# Patient Record
Sex: Male | Born: 1940 | Race: White | Hispanic: No | Smoking: Never smoker
Health system: Southern US, Community
[De-identification: ages and names within clinical notes are randomized; demographics above are authoritative.]

## PROBLEM LIST (undated history)

## (undated) DIAGNOSIS — K589 Irritable bowel syndrome without diarrhea: Secondary | ICD-10-CM

## (undated) DIAGNOSIS — I351 Nonrheumatic aortic (valve) insufficiency: Secondary | ICD-10-CM

## (undated) DIAGNOSIS — I10 Essential (primary) hypertension: Secondary | ICD-10-CM

## (undated) DIAGNOSIS — K219 Gastro-esophageal reflux disease without esophagitis: Secondary | ICD-10-CM

## (undated) DIAGNOSIS — J189 Pneumonia, unspecified organism: Secondary | ICD-10-CM

## (undated) DIAGNOSIS — N1832 Chronic kidney disease, stage 3b: Secondary | ICD-10-CM

## (undated) DIAGNOSIS — E785 Hyperlipidemia, unspecified: Secondary | ICD-10-CM

## (undated) DIAGNOSIS — Z87442 Personal history of urinary calculi: Secondary | ICD-10-CM

## (undated) DIAGNOSIS — R911 Solitary pulmonary nodule: Secondary | ICD-10-CM

## (undated) DIAGNOSIS — I480 Paroxysmal atrial fibrillation: Secondary | ICD-10-CM

## (undated) DIAGNOSIS — F32A Depression, unspecified: Secondary | ICD-10-CM

## (undated) DIAGNOSIS — E1122 Type 2 diabetes mellitus with diabetic chronic kidney disease: Secondary | ICD-10-CM

## (undated) DIAGNOSIS — N400 Enlarged prostate without lower urinary tract symptoms: Secondary | ICD-10-CM

## (undated) DIAGNOSIS — I2510 Atherosclerotic heart disease of native coronary artery without angina pectoris: Secondary | ICD-10-CM

## (undated) DIAGNOSIS — I219 Acute myocardial infarction, unspecified: Secondary | ICD-10-CM

## (undated) HISTORY — PX: CARDIAC CATHETERIZATION: SHX172

## (undated) HISTORY — PX: EXPLORATORY LAPAROTOMY: SUR591

---

## 1981-11-13 HISTORY — PX: EXPLORATORY LAPAROTOMY: SUR591

## 2000-07-14 DIAGNOSIS — I219 Acute myocardial infarction, unspecified: Secondary | ICD-10-CM

## 2000-07-14 HISTORY — DX: Acute myocardial infarction, unspecified: I21.9

## 2000-07-23 HISTORY — PX: CORONARY ARTERY BYPASS GRAFT: SHX141

## 2005-07-20 ENCOUNTER — Ambulatory Visit: Payer: Self-pay | Admitting: Gastroenterology

## 2005-08-03 ENCOUNTER — Ambulatory Visit: Payer: Self-pay | Admitting: Gastroenterology

## 2005-08-03 HISTORY — PX: COLONOSCOPY: SHX174

## 2006-01-08 ENCOUNTER — Ambulatory Visit: Payer: Self-pay | Admitting: Gastroenterology

## 2006-01-08 HISTORY — PX: ESOPHAGOGASTRODUODENOSCOPY: SHX1529

## 2011-07-29 ENCOUNTER — Inpatient Hospital Stay: Payer: Self-pay | Admitting: *Deleted

## 2011-10-03 ENCOUNTER — Ambulatory Visit: Payer: Self-pay | Admitting: Specialist

## 2012-02-28 ENCOUNTER — Ambulatory Visit: Payer: Self-pay | Admitting: Internal Medicine

## 2012-05-15 ENCOUNTER — Ambulatory Visit: Payer: Self-pay | Admitting: Specialist

## 2012-05-15 LAB — CREATININE, SERUM
Creatinine: 1.24 mg/dL (ref 0.60–1.30)
EGFR (African American): >60

## 2014-04-28 ENCOUNTER — Ambulatory Visit: Payer: Self-pay | Admitting: Internal Medicine

## 2015-02-12 ENCOUNTER — Emergency Department
Admit: 2015-02-12 | Disposition: A | Discharge status: Home / Self Care | Payer: Self-pay | Admitting: Emergency Medicine

## 2015-02-12 LAB — COMPREHENSIVE METABOLIC PANEL
ANION GAP: 6 — AB (ref 7–16)
Albumin: 3.3 g/dL — ABNORMAL LOW
Alkaline Phosphatase: 53 U/L
BUN: 28 mg/dL — AB
Bilirubin,Total: 0.7 mg/dL
CALCIUM: 8.5 mg/dL — AB
Chloride: 110 mmol/L
Co2: 24 mmol/L
Creatinine: 1.24 mg/dL
GFR CALC NON AF AMER: 57 — AB
GLUCOSE: 98 mg/dL
Potassium: 4.6 mmol/L
SGOT(AST): 22 U/L
SGPT (ALT): 12 U/L — ABNORMAL LOW
Sodium: 140 mmol/L
TOTAL PROTEIN: 6.4 g/dL — AB

## 2015-02-12 LAB — CBC
HCT: 39.6 % — AB (ref 40.0–52.0)
HGB: 13.1 g/dL (ref 13.0–18.0)
MCH: 32.5 pg (ref 26.0–34.0)
MCHC: 33.2 g/dL (ref 32.0–36.0)
MCV: 98 fL (ref 80–100)
Platelet: 169 10*3/uL (ref 150–440)
RBC: 4.05 10*6/uL — AB (ref 4.40–5.90)
RDW: 13.8 % (ref 11.5–14.5)
WBC: 5.9 10*3/uL (ref 3.8–10.6)

## 2015-02-12 LAB — URINALYSIS, COMPLETE
BACTERIA: NONE SEEN
BILIRUBIN, UR: NEGATIVE
Blood: NEGATIVE
GLUCOSE, UR: NEGATIVE mg/dL (ref 0–75)
KETONE: NEGATIVE
LEUKOCYTE ESTERASE: NEGATIVE
Nitrite: NEGATIVE
Ph: 5 (ref 4.5–8.0)
Protein: 100
SPECIFIC GRAVITY: 1.026 (ref 1.003–1.030)
SQUAMOUS EPITHELIAL: NONE SEEN

## 2015-02-12 LAB — TROPONIN I
Troponin-I: <0.03 ng/mL
Troponin-I: <0.03 ng/mL

## 2017-03-22 NOTE — Discharge Instructions (Signed)
Cataract Surgery, Care After °Refer to this sheet in the next few weeks. These instructions provide you with information about caring for yourself after your procedure. Your health care provider may also give you more specific instructions. Your treatment has been planned according to current medical practices, but problems sometimes occur. Call your health care provider if you have any problems or questions after your procedure. °What can I expect after the procedure? °After the procedure, it is common to have: °· Itching. °· Discomfort. °· Fluid discharge. °· Sensitivity to light and to touch. °· Bruising. °Follow these instructions at home: °Eye Care  °· Check your eye every day for signs of infection. Watch for: °¨ Redness, swelling, or pain. °¨ Fluid, blood, or pus. °¨ Warmth. °¨ Bad smell. °Activity  °· Avoid strenuous activities, such as playing contact sports, for as long as told by your health care provider. °· Do not drive or operate heavy machinery until your health care provider approves. °· Do not bend or lift heavy objects . Bending increases pressure in the eye. You can walk, climb stairs, and do light household chores. °· Ask your health care provider when you can return to work. If you work in a dusty environment, you may be advised to wear protective eyewear for a period of time. °General instructions  °· Take or apply over-the-counter and prescription medicines only as told by your health care provider. This includes eye drops. °· Do not touch or rub your eyes. °· If you were given a protective shield, wear it as told by your health care provider. If you were not given a protective shield, wear sunglasses as told by your health care provider to protect your eyes. °· Keep the area around your eye clean and dry. Avoid swimming or allowing water to hit you directly in the face while showering until told by your health care provider. Keep soap and shampoo out of your eyes. °· Do not put a contact lens  into the affected eye or eyes until your health care provider approves. °· Keep all follow-up visits as told by your health care provider. This is important. °Contact a health care provider if: ° °· You have increased bruising around your eye. °· You have pain that is not helped with medicine. °· You have a fever. °· You have redness, swelling, or pain in your eye. °· You have fluid, blood, or pus coming from your incision. °· Your vision gets worse. °Get help right away if: °· You have sudden vision loss. °This information is not intended to replace advice given to you by your health care provider. Make sure you discuss any questions you have with your health care provider. °Document Released: 05/19/2005 Document Revised: 03/09/2016 Document Reviewed: 09/09/2015 °Elsevier Interactive Patient Education © 2017 [employer] ° ° ° ° °General Anesthesia, Adult, Care After °These instructions provide you with information about caring for yourself after your procedure. Your health care provider may also give you more specific instructions. Your treatment has been planned according to current medical practices, but problems sometimes occur. Call your health care provider if you have any problems or questions after your procedure. °What can I expect after the procedure? °After the procedure, it is common to have: °· Vomiting. °· A sore throat. °· Mental slowness. °It is common to feel: °· Nauseous. °· Cold or shivery. °· Sleepy. °· Tired. °· Sore or achy, even in parts of your body where you did not have surgery. °Follow these instructions at   home: °For at least 24 hours after the procedure:  °· Do not: °¨ Participate in activities where you could fall or become injured. °¨ Drive. °¨ Use heavy machinery. °¨ Drink alcohol. °¨ Take sleeping pills or medicines that cause drowsiness. °¨ Make important decisions or sign legal documents. °¨ Take care of children on your own. °· Rest. °Eating and drinking  °· If you vomit, drink  water, juice, or soup when you can drink without vomiting. °· Drink enough fluid to keep your urine clear or pale yellow. °· Make sure you have little or no nausea before eating solid foods. °· Follow the diet recommended by your health care provider. °General instructions  °· Have a responsible adult stay with you until you are awake and alert. °· Return to your normal activities as told by your health care provider. Ask your health care provider what activities are safe for you. °· Take over-the-counter and prescription medicines only as told by your health care provider. °· If you smoke, do not smoke without supervision. °· Keep all follow-up visits as told by your health care provider. This is important. °Contact a health care provider if: °· You continue to have nausea or vomiting at home, and medicines are not helpful. °· You cannot drink fluids or start eating again. °· You cannot urinate after 8-12 hours. °· You develop a skin rash. °· You have fever. °· You have increasing redness at the site of your procedure. °Get help right away if: °· You have difficulty breathing. °· You have chest pain. °· You have unexpected bleeding. °· You feel that you are having a life-threatening or urgent problem. °This information is not intended to replace advice given to you by your health care provider. Make sure you discuss any questions you have with your health care provider. °Document Released: 02/05/2001 Document Revised: 04/03/2016 Document Reviewed: 10/14/2015 °Elsevier Interactive Patient Education © 2017 [employer] ° °

## 2017-03-26 ENCOUNTER — Encounter: Payer: Self-pay | Admitting: *Deleted

## 2017-03-28 ENCOUNTER — Ambulatory Visit
Admission: RE | Admit: 2017-03-28 | Discharge: 2017-03-28 | Disposition: A | Discharge status: Home / Self Care | Payer: Medicare HMO | Source: Ambulatory Visit | Attending: Ophthalmology | Admitting: Ophthalmology

## 2017-03-28 ENCOUNTER — Ambulatory Visit: Payer: Medicare HMO | Admitting: Anesthesiology

## 2017-03-28 ENCOUNTER — Encounter
Admission: RE | Disposition: A | Discharge status: Home / Self Care | Payer: Self-pay | Source: Ambulatory Visit | Attending: Ophthalmology

## 2017-03-28 DIAGNOSIS — K219 Gastro-esophageal reflux disease without esophagitis: Secondary | ICD-10-CM | POA: Diagnosis not present

## 2017-03-28 DIAGNOSIS — I10 Essential (primary) hypertension: Secondary | ICD-10-CM | POA: Insufficient documentation

## 2017-03-28 DIAGNOSIS — I2510 Atherosclerotic heart disease of native coronary artery without angina pectoris: Secondary | ICD-10-CM | POA: Insufficient documentation

## 2017-03-28 DIAGNOSIS — I252 Old myocardial infarction: Secondary | ICD-10-CM | POA: Insufficient documentation

## 2017-03-28 DIAGNOSIS — H2512 Age-related nuclear cataract, left eye: Secondary | ICD-10-CM | POA: Diagnosis not present

## 2017-03-28 HISTORY — PX: CATARACT EXTRACTION W/PHACO: SHX586

## 2017-03-28 HISTORY — DX: Gastro-esophageal reflux disease without esophagitis: K21.9

## 2017-03-28 HISTORY — DX: Acute myocardial infarction, unspecified: I21.9

## 2017-03-28 HISTORY — DX: Atherosclerotic heart disease of native coronary artery without angina pectoris: I25.10

## 2017-03-28 HISTORY — DX: Nonrheumatic aortic (valve) insufficiency: I35.1

## 2017-03-28 HISTORY — DX: Essential (primary) hypertension: I10

## 2017-03-28 HISTORY — DX: Hyperlipidemia, unspecified: E78.5

## 2017-03-28 SURGERY — PHACOEMULSIFICATION, CATARACT, WITH IOL INSERTION
Anesthesia: Monitor Anesthesia Care | Laterality: Left | Wound class: Clean

## 2017-03-28 MED ORDER — EPINEPHRINE PF 1 MG/ML IJ SOLN
INTRAOCULAR | Status: DC | PRN
  Administered 2017-03-28: 84 mL via OPHTHALMIC

## 2017-03-28 MED ORDER — MOXIFLOXACIN HCL 0.5 % OP SOLN
1.0000 [drp] | OPHTHALMIC | Status: DC | PRN
  Administered 2017-03-28 (×3): 1 [drp] via OPHTHALMIC

## 2017-03-28 MED ORDER — FENTANYL CITRATE (PF) 100 MCG/2ML IJ SOLN
INTRAMUSCULAR | Status: DC | PRN
  Administered 2017-03-28: 50 ug via INTRAVENOUS

## 2017-03-28 MED ORDER — MIDAZOLAM HCL 2 MG/2ML IJ SOLN
INTRAMUSCULAR | Status: DC | PRN
  Administered 2017-03-28: 2 mg via INTRAVENOUS

## 2017-03-28 MED ORDER — ARMC OPHTHALMIC DILATING DROPS
1.0000 "application " | OPHTHALMIC | Status: DC | PRN
  Administered 2017-03-28 (×3): 1 via OPHTHALMIC

## 2017-03-28 MED ORDER — BRIMONIDINE TARTRATE-TIMOLOL 0.2-0.5 % OP SOLN
OPHTHALMIC | Status: DC | PRN
  Administered 2017-03-28: 1 [drp] via OPHTHALMIC

## 2017-03-28 MED ORDER — LIDOCAINE HCL (PF) 2 % IJ SOLN
INTRAOCULAR | Status: DC | PRN
  Administered 2017-03-28: 1 mL via INTRAOCULAR

## 2017-03-28 MED ORDER — CEFUROXIME OPHTHALMIC INJECTION 1 MG/0.1 ML
INJECTION | OPHTHALMIC | Status: DC | PRN
  Administered 2017-03-28: 0.1 mL via OPHTHALMIC

## 2017-03-28 MED ORDER — NA HYALUR & NA CHOND-NA HYALUR 0.4-0.35 ML IO KIT
PACK | INTRAOCULAR | Status: DC | PRN
  Administered 2017-03-28: 1 mL via INTRAOCULAR

## 2017-03-28 MED ORDER — LACTATED RINGERS IV SOLN: INTRAVENOUS | Status: DC

## 2017-03-28 SURGICAL SUPPLY — 25 items
CANNULA ANT/CHMB 27GA (MISCELLANEOUS) ×3 IMPLANT
CARTRIDGE ABBOTT (MISCELLANEOUS) IMPLANT
GLOVE SURG LX 7.5 STRW (GLOVE) ×2
GLOVE SURG LX STRL 7.5 STRW (GLOVE) ×1 IMPLANT
GLOVE SURG TRIUMPH 8.0 PF LTX (GLOVE) ×3 IMPLANT
GOWN STRL REUS W/ TWL LRG LVL3 (GOWN DISPOSABLE) ×2 IMPLANT
GOWN STRL REUS W/TWL LRG LVL3 (GOWN DISPOSABLE) ×4
LENS IOL TECNIS ITEC 21.0 (Intraocular Lens) ×3 IMPLANT
MARKER SKIN DUAL TIP RULER LAB (MISCELLANEOUS) ×3 IMPLANT
NDL RETROBULBAR .5 NSTRL (NEEDLE) IMPLANT
NEEDLE FILTER BLUNT 18X 1/2SAF (NEEDLE) ×2
NEEDLE FILTER BLUNT 18X1 1/2 (NEEDLE) ×1 IMPLANT
PACK CATARACT BRASINGTON (MISCELLANEOUS) ×3 IMPLANT
PACK EYE AFTER SURG (MISCELLANEOUS) ×3 IMPLANT
PACK OPTHALMIC (MISCELLANEOUS) ×3 IMPLANT
RING MALYGIN 7.0 (MISCELLANEOUS) IMPLANT
SUT ETHILON 10-0 CS-B-6CS-B-6 (SUTURE)
SUT VICRYL  9 0 (SUTURE)
SUT VICRYL 9 0 (SUTURE) IMPLANT
SUTURE EHLN 10-0 CS-B-6CS-B-6 (SUTURE) IMPLANT
SYR 3ML LL SCALE MARK (SYRINGE) ×3 IMPLANT
SYR 5ML LL (SYRINGE) ×3 IMPLANT
SYR TB 1ML LUER SLIP (SYRINGE) ×3 IMPLANT
WATER STERILE IRR 250ML POUR (IV SOLUTION) ×3 IMPLANT
WIPE NON LINTING 3.25X3.25 (MISCELLANEOUS) ×3 IMPLANT

## 2017-03-28 NOTE — H&P (Signed)
The History and Physical notes are on paper, have been signed, and are to be scanned. The patient remains stable and unchanged from the H&P.   Previous H&P reviewed, patient examined, and there are no changes.  Jonathan Blankenship 03/28/2017 9:55 AM

## 2017-03-28 NOTE — Op Note (Signed)
OPERATIVE NOTE  Jonathan Blankenship [id number] 03/28/2017   PREOPERATIVE DIAGNOSIS:  Nuclear sclerotic cataract left eye. H25.12   POSTOPERATIVE DIAGNOSIS:    Nuclear sclerotic cataract left eye.     PROCEDURE:  Phacoemusification with posterior chamber intraocular lens placement of the left eye   LENS:   Implant Name Type Inv. Item Serial No. Manufacturer Lot No. LRB No. Used  LENS IOL DIOP 21.0 - [id number] Intraocular Lens LENS IOL DIOP 21.0 [id number]   Left 1        ULTRASOUND TIME: 15  % of 1 minutes 20 seconds, CDE 12.2  SURGEON:  Wyonia Hough, MD   ANESTHESIA:  Topical with tetracaine drops and 2% Xylocaine jelly, augmented with 1% preservative-free intracameral lidocaine.    COMPLICATIONS:  None.   DESCRIPTION OF PROCEDURE:  The patient was identified in the holding room and transported to the operating room and placed in the supine position under the operating microscope.  The left eye was identified as the operative eye and it was prepped and draped in the usual sterile ophthalmic fashion.   A 1 millimeter clear-corneal paracentesis was made at the 1:30 position.  0.5 ml of preservative-free 1% lidocaine was injected into the anterior chamber.  The anterior chamber was filled with Viscoat viscoelastic.  A 2.4 millimeter keratome was used to make a near-clear corneal incision at the 10:30 position.  .  A curvilinear capsulorrhexis was made with a cystotome and capsulorrhexis forceps.  Balanced salt solution was used to hydrodissect and hydrodelineate the nucleus.   Phacoemulsification was then used in stop and chop fashion to remove the lens nucleus and epinucleus.  The remaining cortex was then removed using the irrigation and aspiration handpiece. Provisc was then placed into the capsular bag to distend it for lens placement.  A lens was then injected into the capsular bag.  The remaining viscoelastic was aspirated.   Wounds were hydrated with balanced salt  solution.  The anterior chamber was inflated to a physiologic pressure with balanced salt solution.  No wound leaks were noted. Cefuroxime 0.1 ml of a 10mg /ml solution was injected into the anterior chamber for a dose of 1 mg of intracameral antibiotic at the completion of the case.   Timolol and Brimonidine drops were applied to the eye.  The patient was taken to the recovery room in stable condition without complications of anesthesia or surgery.  Jonathan Blankenship 03/28/2017, 11:31 AM

## 2017-03-28 NOTE — Anesthesia Preprocedure Evaluation (Signed)
Anesthesia Evaluation  Patient identified by MRN, date of birth, ID band Patient awake    Reviewed: Allergy & Precautions, H&P , NPO status , Patient's Chart, lab work & pertinent test results  Airway Mallampati: III  TM Distance: >3 FB Neck ROM: full    Dental no notable dental hx.    Pulmonary    Pulmonary exam normal        Cardiovascular hypertension, + CAD and + Past MI  Normal cardiovascular exam     Neuro/Psych    GI/Hepatic GERD  ,  Endo/Other    Renal/GU      Musculoskeletal   Abdominal   Peds  Hematology   Anesthesia Other Findings   Reproductive/Obstetrics                             Anesthesia Physical Anesthesia Plan  ASA: II  Anesthesia Plan: MAC   Post-op Pain Management:    Induction:   Airway Management Planned:   Additional Equipment:   Intra-op Plan:   Post-operative Plan:   Informed Consent: I have reviewed the patients History and Physical, chart, labs and discussed the procedure including the risks, benefits and alternatives for the proposed anesthesia with the patient or authorized representative who has indicated his/her understanding and acceptance.     Plan Discussed with:   Anesthesia Plan Comments:         Anesthesia Quick Evaluation

## 2017-03-28 NOTE — Transfer of Care (Signed)
Immediate Anesthesia Transfer of Care Note  Patient: Jonathan Blankenship  Procedure(s) Performed: Procedure(s) with comments: CATARACT EXTRACTION PHACO AND INTRAOCULAR LENS PLACEMENT (IOC) LEFT (Left) - IVA BLOCK LEFT  Patient Location: PACU  Anesthesia Type: MAC  Level of Consciousness: awake, alert  and patient cooperative  Airway and Oxygen Therapy: Patient Spontanous Breathing and Patient connected to supplemental oxygen  Post-op Assessment: Post-op Vital signs reviewed, Patient's Cardiovascular Status Stable, Respiratory Function Stable, Patent Airway and No signs of Nausea or vomiting  Post-op Vital Signs: Reviewed and stable  Complications: No apparent anesthesia complications

## 2017-03-28 NOTE — Anesthesia Procedure Notes (Signed)
Procedure Name: MAC Performed by: Taneal Sonntag Pre-anesthesia Checklist: Patient identified, Emergency Drugs available, Suction available, Timeout performed and Patient being monitored Patient Re-evaluated:Patient Re-evaluated prior to inductionOxygen Delivery Method: Nasal cannula Placement Confirmation: positive ETCO2     

## 2017-03-28 NOTE — Anesthesia Postprocedure Evaluation (Signed)
Anesthesia Post Note  Patient: Jonathan Blankenship  Procedure(s) Performed: Procedure(s) (LRB): CATARACT EXTRACTION PHACO AND INTRAOCULAR LENS PLACEMENT (IOC) LEFT (Left)  Patient location during evaluation: PACU Anesthesia Type: MAC Level of consciousness: awake and alert and oriented Pain management: satisfactory to patient Vital Signs Assessment: post-procedure vital signs reviewed and stable Respiratory status: spontaneous breathing, nonlabored ventilation and respiratory function stable Cardiovascular status: blood pressure returned to baseline and stable Postop Assessment: Adequate PO intake and No signs of nausea or vomiting Anesthetic complications: no    Raliegh Ip

## 2017-03-29 ENCOUNTER — Encounter: Payer: Self-pay | Admitting: Ophthalmology

## 2017-04-11 ENCOUNTER — Encounter: Payer: Self-pay | Admitting: *Deleted

## 2017-04-12 NOTE — Discharge Instructions (Signed)

## 2017-04-17 MED ORDER — ACETAMINOPHEN 160 MG/5ML PO SOLN: 325.0000 mg | ORAL | Status: DC | PRN

## 2017-04-17 MED ORDER — ACETAMINOPHEN 325 MG PO TABS: 325.0000 mg | ORAL_TABLET | ORAL | Status: DC | PRN

## 2017-04-18 ENCOUNTER — Ambulatory Visit: Payer: Medicare HMO | Admitting: Anesthesiology

## 2017-04-18 ENCOUNTER — Ambulatory Visit
Admission: RE | Admit: 2017-04-18 | Discharge: 2017-04-18 | Disposition: A | Discharge status: Home / Self Care | Payer: Medicare HMO | Source: Ambulatory Visit | Attending: Ophthalmology | Admitting: Ophthalmology

## 2017-04-18 ENCOUNTER — Encounter
Admission: RE | Disposition: A | Discharge status: Home / Self Care | Payer: Self-pay | Source: Ambulatory Visit | Attending: Ophthalmology

## 2017-04-18 DIAGNOSIS — K219 Gastro-esophageal reflux disease without esophagitis: Secondary | ICD-10-CM | POA: Diagnosis not present

## 2017-04-18 DIAGNOSIS — I10 Essential (primary) hypertension: Secondary | ICD-10-CM | POA: Insufficient documentation

## 2017-04-18 DIAGNOSIS — F329 Major depressive disorder, single episode, unspecified: Secondary | ICD-10-CM | POA: Insufficient documentation

## 2017-04-18 DIAGNOSIS — I252 Old myocardial infarction: Secondary | ICD-10-CM | POA: Diagnosis not present

## 2017-04-18 DIAGNOSIS — Z951 Presence of aortocoronary bypass graft: Secondary | ICD-10-CM | POA: Insufficient documentation

## 2017-04-18 DIAGNOSIS — H2511 Age-related nuclear cataract, right eye: Secondary | ICD-10-CM | POA: Diagnosis present

## 2017-04-18 DIAGNOSIS — E7800 Pure hypercholesterolemia, unspecified: Secondary | ICD-10-CM | POA: Diagnosis not present

## 2017-04-18 DIAGNOSIS — I2510 Atherosclerotic heart disease of native coronary artery without angina pectoris: Secondary | ICD-10-CM | POA: Diagnosis not present

## 2017-04-18 HISTORY — PX: CATARACT EXTRACTION W/PHACO: SHX586

## 2017-04-18 SURGERY — PHACOEMULSIFICATION, CATARACT, WITH IOL INSERTION
Anesthesia: Monitor Anesthesia Care | Site: Eye | Laterality: Right

## 2017-04-18 MED ORDER — MIDAZOLAM HCL 2 MG/2ML IJ SOLN
INTRAMUSCULAR | Status: DC | PRN
  Administered 2017-04-18: 2 mg via INTRAVENOUS

## 2017-04-18 MED ORDER — ONDANSETRON HCL 4 MG/2ML IJ SOLN: 4.0000 mg | Freq: Once | INTRAMUSCULAR | Status: DC | PRN

## 2017-04-18 MED ORDER — NA HYALUR & NA CHOND-NA HYALUR 0.4-0.35 ML IO KIT
PACK | INTRAOCULAR | Status: DC | PRN
  Administered 2017-04-18: 1 mL via INTRAOCULAR

## 2017-04-18 MED ORDER — FENTANYL CITRATE (PF) 100 MCG/2ML IJ SOLN
INTRAMUSCULAR | Status: DC | PRN
  Administered 2017-04-18: 50 ug via INTRAVENOUS

## 2017-04-18 MED ORDER — ARMC OPHTHALMIC DILATING DROPS
1.0000 "application " | OPHTHALMIC | Status: DC | PRN
  Administered 2017-04-18 (×3): 1 via OPHTHALMIC

## 2017-04-18 MED ORDER — ACETAMINOPHEN 160 MG/5ML PO SOLN: 325.0000 mg | ORAL | Status: DC | PRN

## 2017-04-18 MED ORDER — LIDOCAINE HCL (PF) 2 % IJ SOLN
INTRAMUSCULAR | Status: DC | PRN
  Administered 2017-04-18: 1 mL via INTRAOCULAR

## 2017-04-18 MED ORDER — BRIMONIDINE TARTRATE-TIMOLOL 0.2-0.5 % OP SOLN
OPHTHALMIC | Status: DC | PRN
  Administered 2017-04-18: 1 [drp] via OPHTHALMIC

## 2017-04-18 MED ORDER — MOXIFLOXACIN HCL 0.5 % OP SOLN
1.0000 [drp] | OPHTHALMIC | Status: DC | PRN
  Administered 2017-04-18 (×3): 1 [drp] via OPHTHALMIC

## 2017-04-18 MED ORDER — CEFUROXIME OPHTHALMIC INJECTION 1 MG/0.1 ML
INJECTION | OPHTHALMIC | Status: DC | PRN
  Administered 2017-04-18: .3 mL via OPHTHALMIC

## 2017-04-18 MED ORDER — EPINEPHRINE PF 1 MG/ML IJ SOLN
INTRAMUSCULAR | Status: DC | PRN
  Administered 2017-04-18: 58 mL via OPHTHALMIC

## 2017-04-18 MED ORDER — ACETAMINOPHEN 325 MG PO TABS: 325.0000 mg | ORAL_TABLET | ORAL | Status: DC | PRN

## 2017-04-18 SURGICAL SUPPLY — 25 items
CANNULA ANT/CHMB 27GA (MISCELLANEOUS) ×3 IMPLANT
CARTRIDGE ABBOTT (MISCELLANEOUS) IMPLANT
GLOVE SURG LX 7.5 STRW (GLOVE) ×2
GLOVE SURG LX STRL 7.5 STRW (GLOVE) ×1 IMPLANT
GLOVE SURG TRIUMPH 8.0 PF LTX (GLOVE) ×3 IMPLANT
GOWN STRL REUS W/ TWL LRG LVL3 (GOWN DISPOSABLE) ×2 IMPLANT
GOWN STRL REUS W/TWL LRG LVL3 (GOWN DISPOSABLE) ×4
LENS IOL TECNIS ITEC 21.0 (Intraocular Lens) ×3 IMPLANT
MARKER SKIN DUAL TIP RULER LAB (MISCELLANEOUS) ×3 IMPLANT
NDL RETROBULBAR .5 NSTRL (NEEDLE) IMPLANT
NEEDLE FILTER BLUNT 18X 1/2SAF (NEEDLE) ×2
NEEDLE FILTER BLUNT 18X1 1/2 (NEEDLE) ×1 IMPLANT
PACK CATARACT BRASINGTON (MISCELLANEOUS) ×3 IMPLANT
PACK EYE AFTER SURG (MISCELLANEOUS) ×3 IMPLANT
PACK OPTHALMIC (MISCELLANEOUS) ×3 IMPLANT
RING MALYGIN 7.0 (MISCELLANEOUS) IMPLANT
SUT ETHILON 10-0 CS-B-6CS-B-6 (SUTURE)
SUT VICRYL  9 0 (SUTURE)
SUT VICRYL 9 0 (SUTURE) IMPLANT
SUTURE EHLN 10-0 CS-B-6CS-B-6 (SUTURE) IMPLANT
SYR 3ML LL SCALE MARK (SYRINGE) ×3 IMPLANT
SYR 5ML LL (SYRINGE) ×3 IMPLANT
SYR TB 1ML LUER SLIP (SYRINGE) ×3 IMPLANT
WATER STERILE IRR 250ML POUR (IV SOLUTION) ×3 IMPLANT
WIPE NON LINTING 3.25X3.25 (MISCELLANEOUS) ×3 IMPLANT

## 2017-04-18 NOTE — Transfer of Care (Signed)
Immediate Anesthesia Transfer of Care Note  Patient: [name]  Procedure(s) Performed: Procedure(s) with comments: CATARACT EXTRACTION PHACO AND INTRAOCULAR LENS PLACEMENT (IOC) Right IVA BLOCK (Right) - IVA BLOCK  Patient Location: PACU  Anesthesia Type: MAC  Level of Consciousness: awake, alert  and patient cooperative  Airway and Oxygen Therapy: Patient Spontanous Breathing and Patient connected to supplemental oxygen  Post-op Assessment: Post-op Vital signs reviewed, Patient's Cardiovascular Status Stable, Respiratory Function Stable, Patent Airway and No signs of Nausea or vomiting  Post-op Vital Signs: Reviewed and stable  Complications: No apparent anesthesia complications

## 2017-04-18 NOTE — Anesthesia Postprocedure Evaluation (Signed)
Anesthesia Post Note  Patient: Jonathan Blankenship  Procedure(s) Performed: Procedure(s) (LRB): CATARACT EXTRACTION PHACO AND INTRAOCULAR LENS PLACEMENT (IOC) Right IVA BLOCK (Right)  Patient location during evaluation: PACU Anesthesia Type: MAC Level of consciousness: awake and alert Pain management: pain level controlled Vital Signs Assessment: post-procedure vital signs reviewed and stable Respiratory status: spontaneous breathing, nonlabored ventilation and respiratory function stable Cardiovascular status: stable Postop Assessment: no signs of nausea or vomiting Anesthetic complications: no    Veda Canning

## 2017-04-18 NOTE — Anesthesia Preprocedure Evaluation (Signed)
Anesthesia Evaluation  Patient identified by MRN, date of birth, ID band Patient awake    Reviewed: Allergy & Precautions, H&P , NPO status , Patient's Chart, lab work & pertinent test results  Airway Mallampati: III  TM Distance: >3 FB Neck ROM: full    Dental no notable dental hx.    Pulmonary neg pulmonary ROS,    Pulmonary exam normal        Cardiovascular hypertension, + CAD and + Past MI  Normal cardiovascular exam  Hyperlipidemia    Neuro/Psych    GI/Hepatic GERD  ,  Endo/Other    Renal/GU      Musculoskeletal   Abdominal   Peds  Hematology   Anesthesia Other Findings   Reproductive/Obstetrics                             Anesthesia Physical  Anesthesia Plan  ASA: II  Anesthesia Plan: MAC   Post-op Pain Management:    Induction:   PONV Risk Score and Plan:   Airway Management Planned:   Additional Equipment:   Intra-op Plan:   Post-operative Plan:   Informed Consent: I have reviewed the patients History and Physical, chart, labs and discussed the procedure including the risks, benefits and alternatives for the proposed anesthesia with the patient or authorized representative who has indicated his/her understanding and acceptance.     Plan Discussed with:   Anesthesia Plan Comments:         Anesthesia Quick Evaluation

## 2017-04-18 NOTE — Op Note (Signed)
LOCATION:  [place]   PREOPERATIVE DIAGNOSIS:    Nuclear sclerotic cataract right eye. H25.11   POSTOPERATIVE DIAGNOSIS:  Nuclear sclerotic cataract right eye.     PROCEDURE:  Phacoemusification with posterior chamber intraocular lens placement of the right eye   LENS:   Implant Name Type Inv. Item Serial No. Manufacturer Lot No. LRB No. Used  LENS IOL DIOP 21.0 - [id number] Intraocular Lens LENS IOL DIOP 21.0 [id number]   Right 1        ULTRASOUND TIME: 16 % of 2 minutes, 5 seconds.  CDE 20.0   SURGEON:  Wyonia Hough, MD   ANESTHESIA:  Topical with tetracaine drops and 2% Xylocaine jelly, augmented with 1% preservative-free intracameral lidocaine.    COMPLICATIONS:  None.   DESCRIPTION OF PROCEDURE:  The patient was identified in the holding room and transported to the operating room and placed in the supine position under the operating microscope.  The right eye was identified as the operative eye and it was prepped and draped in the usual sterile ophthalmic fashion.   A 1 millimeter clear-corneal paracentesis was made at the 12:00 position.  0.5 ml of preservative-free 1% lidocaine was injected into the anterior chamber. The anterior chamber was filled with Viscoat viscoelastic.  A 2.4 millimeter keratome was used to make a near-clear corneal incision at the 9:00 position.  A curvilinear capsulorrhexis was made with a cystotome and capsulorrhexis forceps.  Balanced salt solution was used to hydrodissect and hydrodelineate the nucleus.   Phacoemulsification was then used in stop and chop fashion to remove the lens nucleus and epinucleus.  The remaining cortex was then removed using the irrigation and aspiration handpiece. Provisc was then placed into the capsular bag to distend it for lens placement.  A lens was then injected into the capsular bag.  The remaining viscoelastic was aspirated.   Wounds were hydrated with balanced salt solution.  The anterior  chamber was inflated to a physiologic pressure with balanced salt solution.  No wound leaks were noted. Cefuroxime 0.1 ml of a 10mg /ml solution was injected into the anterior chamber for a dose of 1 mg of intracameral antibiotic at the completion of the case.   Timolol and Brimonidine drops were applied to the eye.  The patient was taken to the recovery room in stable condition without complications of anesthesia or surgery.   Jonathan Blankenship 04/18/2017, 12:03 PM

## 2017-04-18 NOTE — Anesthesia Procedure Notes (Signed)
Procedure Name: MAC Performed by: Jadakiss Barish Pre-anesthesia Checklist: Patient identified, Emergency Drugs available, Suction available, Timeout performed and Patient being monitored Patient Re-evaluated:Patient Re-evaluated prior to inductionOxygen Delivery Method: Nasal cannula Placement Confirmation: positive ETCO2     

## 2017-04-18 NOTE — H&P (Signed)
The History and Physical notes are on paper, have been signed, and are to be scanned. The patient remains stable and unchanged from the H&P.   Previous H&P reviewed, patient examined, and there are no changes.  Mickelle Goupil 04/18/2017 11:12 AM

## 2017-04-19 ENCOUNTER — Encounter: Payer: Self-pay | Admitting: Ophthalmology

## 2020-01-12 ENCOUNTER — Other Ambulatory Visit: Payer: Self-pay | Admitting: Nephrology

## 2020-01-12 ENCOUNTER — Other Ambulatory Visit (HOSPITAL_COMMUNITY): Payer: Self-pay | Admitting: Nephrology

## 2020-01-12 DIAGNOSIS — E1122 Type 2 diabetes mellitus with diabetic chronic kidney disease: Secondary | ICD-10-CM

## 2020-01-12 DIAGNOSIS — N1832 Chronic kidney disease, stage 3b: Secondary | ICD-10-CM

## 2020-01-12 DIAGNOSIS — R809 Proteinuria, unspecified: Secondary | ICD-10-CM

## 2020-01-12 DIAGNOSIS — I129 Hypertensive chronic kidney disease with stage 1 through stage 4 chronic kidney disease, or unspecified chronic kidney disease: Secondary | ICD-10-CM

## 2020-01-21 ENCOUNTER — Ambulatory Visit: Payer: Medicare HMO

## 2020-01-23 ENCOUNTER — Other Ambulatory Visit: Payer: Self-pay

## 2020-01-23 ENCOUNTER — Ambulatory Visit
Admission: RE | Admit: 2020-01-23 | Discharge: 2020-01-23 | Disposition: A | Discharge status: Home / Self Care | Payer: Medicare HMO | Source: Ambulatory Visit | Attending: Nephrology | Admitting: Nephrology

## 2020-01-23 DIAGNOSIS — N1832 Chronic kidney disease, stage 3b: Secondary | ICD-10-CM | POA: Insufficient documentation

## 2020-01-23 DIAGNOSIS — N184 Chronic kidney disease, stage 4 (severe): Secondary | ICD-10-CM | POA: Insufficient documentation

## 2020-01-23 DIAGNOSIS — E1122 Type 2 diabetes mellitus with diabetic chronic kidney disease: Secondary | ICD-10-CM | POA: Diagnosis present

## 2020-01-23 DIAGNOSIS — R809 Proteinuria, unspecified: Secondary | ICD-10-CM | POA: Insufficient documentation

## 2020-01-23 DIAGNOSIS — I129 Hypertensive chronic kidney disease with stage 1 through stage 4 chronic kidney disease, or unspecified chronic kidney disease: Secondary | ICD-10-CM | POA: Insufficient documentation

## 2020-02-03 ENCOUNTER — Other Ambulatory Visit (INDEPENDENT_AMBULATORY_CARE_PROVIDER_SITE_OTHER): Payer: Self-pay | Admitting: Nephrology

## 2020-02-03 DIAGNOSIS — N261 Atrophy of kidney (terminal): Secondary | ICD-10-CM

## 2020-02-04 ENCOUNTER — Other Ambulatory Visit: Payer: Self-pay

## 2020-02-04 ENCOUNTER — Other Ambulatory Visit (INDEPENDENT_AMBULATORY_CARE_PROVIDER_SITE_OTHER): Payer: Self-pay | Admitting: Nephrology

## 2020-02-04 ENCOUNTER — Ambulatory Visit (INDEPENDENT_AMBULATORY_CARE_PROVIDER_SITE_OTHER): Payer: Medicare HMO

## 2020-02-04 DIAGNOSIS — N261 Atrophy of kidney (terminal): Secondary | ICD-10-CM

## 2020-02-04 DIAGNOSIS — N183 Chronic kidney disease, stage 3 unspecified: Secondary | ICD-10-CM

## 2021-11-25 ENCOUNTER — Encounter: Payer: Self-pay | Admitting: Emergency Medicine

## 2021-11-25 ENCOUNTER — Other Ambulatory Visit: Payer: Self-pay

## 2021-11-25 DIAGNOSIS — I2510 Atherosclerotic heart disease of native coronary artery without angina pectoris: Secondary | ICD-10-CM | POA: Diagnosis not present

## 2021-11-25 DIAGNOSIS — R0602 Shortness of breath: Secondary | ICD-10-CM | POA: Diagnosis present

## 2021-11-25 DIAGNOSIS — J189 Pneumonia, unspecified organism: Secondary | ICD-10-CM | POA: Insufficient documentation

## 2021-11-25 DIAGNOSIS — E119 Type 2 diabetes mellitus without complications: Secondary | ICD-10-CM | POA: Diagnosis not present

## 2021-11-25 DIAGNOSIS — Z20822 Contact with and (suspected) exposure to covid-19: Secondary | ICD-10-CM | POA: Diagnosis not present

## 2021-11-25 DIAGNOSIS — I10 Essential (primary) hypertension: Secondary | ICD-10-CM | POA: Insufficient documentation

## 2021-11-25 LAB — COMPREHENSIVE METABOLIC PANEL
ALT: 24 U/L (ref 0–44)
AST: 29 U/L (ref 15–41)
Albumin: 3.7 g/dL (ref 3.5–5.0)
Alkaline Phosphatase: 75 U/L (ref 38–126)
Anion gap: 8 (ref 5–15)
BUN: 26 mg/dL — ABNORMAL HIGH (ref 8–23)
CO2: 25 mmol/L (ref 22–32)
Calcium: 9.4 mg/dL (ref 8.9–10.3)
Chloride: 107 mmol/L (ref 98–111)
Creatinine, Ser: 1.19 mg/dL (ref 0.61–1.24)
GFR, Estimated: >60 mL/min (ref >60)
Glucose, Bld: 116 mg/dL — ABNORMAL HIGH (ref 70–99)
Potassium: 4.3 mmol/L (ref 3.5–5.1)
Sodium: 140 mmol/L (ref 135–145)
Total Bilirubin: 0.6 mg/dL (ref 0.3–1.2)
Total Protein: 7.4 g/dL (ref 6.5–8.1)

## 2021-11-25 LAB — CBC WITH DIFFERENTIAL/PLATELET
Abs Immature Granulocytes: 0.03 10*3/uL (ref 0.00–0.07)
Basophils Absolute: 0 10*3/uL (ref 0.0–0.1)
Basophils Relative: 0 %
Eosinophils Absolute: 0.4 10*3/uL (ref 0.0–0.5)
Eosinophils Relative: 4 %
HCT: 40.2 % (ref 39.0–52.0)
Hemoglobin: 13.3 g/dL (ref 13.0–17.0)
Immature Granulocytes: 0 %
Lymphocytes Relative: 10 %
Lymphs Abs: 0.9 10*3/uL (ref 0.7–4.0)
MCH: 32.9 pg (ref 26.0–34.0)
MCHC: 33.1 g/dL (ref 30.0–36.0)
MCV: 99.5 fL (ref 80.0–100.0)
Monocytes Absolute: 0.8 10*3/uL (ref 0.1–1.0)
Monocytes Relative: 9 %
Neutro Abs: 6.8 10*3/uL (ref 1.7–7.7)
Neutrophils Relative %: 77 %
Platelets: 173 10*3/uL (ref 150–400)
RBC: 4.04 MIL/uL — ABNORMAL LOW (ref 4.22–5.81)
RDW: 11.9 % (ref 11.5–15.5)
WBC: 8.9 10*3/uL (ref 4.0–10.5)
nRBC: 0 % (ref 0.0–0.2)

## 2021-11-25 LAB — TROPONIN I (HIGH SENSITIVITY): Troponin I (High Sensitivity): 8 ng/L (ref <18)

## 2021-11-25 NOTE — ED Provider Triage Note (Signed)
Emergency Medicine Provider Triage Evaluation Note  Jonathan Blankenship , a 81 y.o. male  was evaluated in triage.  Pt complains of worsening shortness of breath.  Patient was seen at walk-in clinic and was diagnosed with community-acquired pneumonia and was referred to the emergency department for new hypoxia.  Patient denies current chest pain.  He states that he has had a productive cough.  No antibiotic usage at home.  No recent admissions.  Review of Systems  Positive: Patient has cough and shortness of breath.  Negative: No nausea, vomiting or abdominal pain.   Physical Exam  There were no vitals taken for this visit. Gen:   Awake, no distress   Resp:  Normal effort  MSK:   Moves extremities without difficulty  Other:    Medical Decision Making  Medically screening exam initiated at 6:16 PM.  Appropriate orders placed.  [name] was informed that the remainder of the evaluation will be completed by another provider, this initial triage assessment does not replace that evaluation, and the importance of remaining in the ED until their evaluation is complete.     Vallarie Mare Kelly, [place] 11/25/21 1818

## 2021-11-25 NOTE — ED Triage Notes (Signed)
Patient to ED via POV. Patient was sent by [facility] with pneumonia. Patient states he has been SOB x2 days. Patient talking in full sentences. NAD noted.

## 2021-11-26 ENCOUNTER — Emergency Department: Payer: Medicare HMO

## 2021-11-26 ENCOUNTER — Emergency Department
Admission: EM | Admit: 2021-11-26 | Discharge: 2021-11-26 | Disposition: A | Discharge status: Home / Self Care | Payer: Medicare HMO | Attending: Emergency Medicine | Admitting: Emergency Medicine

## 2021-11-26 DIAGNOSIS — J189 Pneumonia, unspecified organism: Secondary | ICD-10-CM | POA: Diagnosis not present

## 2021-11-26 LAB — TROPONIN I (HIGH SENSITIVITY): Troponin I (High Sensitivity): 16 ng/L (ref <18)

## 2021-11-26 LAB — RESP PANEL BY RT-PCR (FLU A&B, COVID) ARPGX2
Influenza A by PCR: NEGATIVE
Influenza B by PCR: NEGATIVE
SARS Coronavirus 2 by RT PCR: NEGATIVE

## 2021-11-26 MED ORDER — AZITHROMYCIN 500 MG PO TABS
500.0000 mg | ORAL_TABLET | Freq: Once | ORAL | Status: AC
  Administered 2021-11-26: 500 mg via ORAL
  Filled 2021-11-26: qty 1

## 2021-11-26 MED ORDER — ALBUTEROL SULFATE HFA 108 (90 BASE) MCG/ACT IN AERS: 2.0000 | INHALATION_SPRAY | RESPIRATORY_TRACT | 0 refills | Status: AC | PRN

## 2021-11-26 MED ORDER — ALBUTEROL SULFATE (2.5 MG/3ML) 0.083% IN NEBU
2.5000 mg | INHALATION_SOLUTION | Freq: Once | RESPIRATORY_TRACT | Status: AC
  Administered 2021-11-26: 2.5 mg via RESPIRATORY_TRACT
  Filled 2021-11-26: qty 3

## 2021-11-26 MED ORDER — AZITHROMYCIN 250 MG PO TABS: 250.0000 mg | ORAL_TABLET | Freq: Every day | ORAL | 0 refills | Status: AC

## 2021-11-26 MED ORDER — PREDNISONE 20 MG PO TABS: 60.0000 mg | ORAL_TABLET | Freq: Every day | ORAL | 0 refills | Status: AC

## 2021-11-26 MED ORDER — PREDNISONE 20 MG PO TABS
60.0000 mg | ORAL_TABLET | Freq: Once | ORAL | Status: AC
Start: 2021-11-26 — End: 2021-11-26
  Administered 2021-11-26: 60 mg via ORAL
  Filled 2021-11-26: qty 3

## 2021-11-26 NOTE — ED Provider Notes (Signed)
[facility] Provider Note    Event Date/Time   First MD Initiated Contact with Patient 11/26/21 1053     (approximate)   History   Shortness of Breath   HPI  Jonathan Blankenship is a 81 y.o. male with history of CAD, atrial fibrillation, hypertension, and diabetes who presents with shortness of breath over the last day, associated with wheezing.  The patient has had some cough but denies fever or chest pain.  He states he felt similarly to when he previously had pneumonia.  He went to the walk-in clinic yesterday afternoon and states that they ran a bunch of tests and told him he likely had pneumonia.  They were concerned about his low oxygen level which was 85% on room air and sent him to the ER for further evaluation.  Due to ED crowding, the patient subsequently waited 16 hours in the waiting room until he was placed in exam room for evaluation.   Physical Exam   Triage Vital Signs: ED Triage Vitals  Enc Vitals Group     BP 11/25/21 1816 (!) 172/78     Pulse Rate 11/25/21 1816 81     Resp 11/25/21 1816 20     Temp 11/25/21 1816 98.2 F (36.8 C)     Temp Source 11/25/21 1816 Oral     SpO2 11/25/21 1816 91 %     Weight 11/25/21 1817 213 lb (96.6 kg)     Height 11/25/21 1817 5\' 9"  (1.753 m)     Head Circumference --      Peak Flow --      Pain Score 11/25/21 1816 4     Pain Loc --      Pain Edu? --      Excl. in [place]? --     Most recent vital signs: Vitals:   11/26/21 1200 11/26/21 1230  BP: (!) 145/70 (!) 139/59  Pulse: 74 87  Resp: (!) 26 (!) 22  Temp:    SpO2: 100% 98%     General: Awake, no distress.  Well-appearing. CV:  Good peripheral perfusion.  Normal heart sounds. Resp:  Normal effort.  Diffuse wheezing bilaterally with good air entry. Abd:  No distention.  Other:  No peripheral edema.   ED Results / Procedures / Treatments   Labs (all labs ordered are listed, but only abnormal results are displayed) Labs Reviewed  CBC WITH  DIFFERENTIAL/PLATELET - Abnormal; Notable for the following components:      Result Value   RBC 4.04 (*)    All other components within normal limits  COMPREHENSIVE METABOLIC PANEL - Abnormal; Notable for the following components:   Glucose, Bld 116 (*)    BUN 26 (*)    All other components within normal limits  RESP PANEL BY RT-PCR (FLU A&B, COVID) ARPGX2  URINALYSIS, COMPLETE (UACMP) WITH MICROSCOPIC  TROPONIN I (HIGH SENSITIVITY)  TROPONIN I (HIGH SENSITIVITY)  TROPONIN I (HIGH SENSITIVITY)     EKG  ED ECG REPORT I, Arta Silence, the attending physician, personally viewed and interpreted this ECG.  Date: 11/25/2021 EKG Time: 1823 Rate: 82 Rhythm: normal sinus rhythm QRS Axis: normal Intervals: First-degree AV block, nonspecific IVCD ST/T Wave abnormalities: Nonspecific abnormalities Narrative Interpretation: no evidence of acute ischemia; no recent prior EKG available for comparison    RADIOLOGY  Chest x-ray interpreted by me shows no focal consolidation or edema; I also reviewed the radiology report which confirms no acute findings  CT chest: I  reviewed the radiology report which shows groundglass opacities in the right apex suggestive of scarring versus interstitial pneumonia:  IMPRESSION:  New small foci of ground-glass densities in the medial right apex  suggesting scarring or interstitial pneumonia. There is interval  increase in interstitial markings in the posterior lower lung  fields, possibly suggesting scarring. There is no focal pulmonary  consolidation. No significant lymphadenopathy seen in mediastinum.     Coronary artery calcifications are seen. There is ectasia of main  pulmonary artery suggesting pulmonary arterial hypertension. Small  hiatal hernia. Right renal stone. Possible left renal cyst.     Other findings as described in the body of the report.    PROCEDURES:  Critical Care performed: No  Procedures   MEDICATIONS ORDERED IN  ED: Medications  albuterol (PROVENTIL) (2.5 MG/3ML) 0.083% nebulizer solution 2.5 mg (2.5 mg Nebulization Given 11/26/21 1148)  albuterol (PROVENTIL) (2.5 MG/3ML) 0.083% nebulizer solution 2.5 mg (2.5 mg Nebulization Given 11/26/21 1148)  predniSONE (DELTASONE) tablet 60 mg (60 mg Oral Given 11/26/21 1146)  azithromycin (ZITHROMAX) tablet 500 mg (500 mg Oral Given 11/26/21 1255)     IMPRESSION / MDM / ASSESSMENT AND PLAN / ED COURSE  I reviewed the triage vital signs and the nursing notes.  81 year old male with PMH as noted above presents with shortness of breath and wheezing since yesterday, with a concern that he may have pneumonia.  He was seen at the walk-in clinic and sent to the ED for further evaluation due to concern for pneumonia and hypoxia.  On exam currently, the patient is overall well-appearing and his vital signs are normal.  He is on 2 L O2 by nasal cannula, when I turned it off his O2 saturation remained in the mid 90s.  He has no respiratory distress.  However he does have diffuse wheezing bilaterally.  There is no peripheral edema.  Differential diagnosis includes, but is not limited to, acute bronchitis, viral lower respiratory infection, atypical pneumonia, bronchospasm/reactive airways new onset COPD, or less likely cardiac cause.  The patient has an abnormal EKG with no recent prior available for comparison, however he has no chest pain or other cardiac symptoms.  There is no evidence of new onset CHF.  I also do not suspect PE given presence of wheezing and the lack of tachycardia, chest pain, or DVT symptoms.  I attempted to review the patient's chart from the walk-in yesterday, but I am unable to see the note or any test results.  I did review the patient's most recent internal medicine visit from 9/13 during which she was seen for URI symptoms and diagnosed with pharyngitis.  Lab work-up obtained yesterday evening from triage is overall unremarkable.  Troponin is negative,  he has no leukocytosis, and electrolytes are normal.  Respiratory panel is negative.  The patient is on the cardiac monitor to evaluate for evidence of arrhythmia and/or significant heart rate changes.  Due to the concern for possible pneumonia (and patient's reporting of an abnormal x-ray yesterday, with no signs of consolidation on x-ray today) we will obtain a CT chest to evaluate for possible pneumonia not seen on x-ray.  We will treat with steroid and bronchodilators and reassess.  ----------------------------------------- 1:40 PM on 11/26/2021 -----------------------------------------  CT chest does show interstitial markings in the right apex consistent with possible atypical pneumonia.  Therefore, I will treat with azithromycin.  A dose was given here in the ED.  There was no repeat troponin done last night, so I obtained a  repeat now given the patient's abnormal EKG.  He has no chest pain or ACS symptoms.  The repeat troponin remains negative (it is slightly increased from prior, but this is not clinically significant given the more than 18-hour elapsed time between the 2 tests).  There is no clinical evidence of ACS at this time or indication for further ED cardiac work-up or observation.  On reassessment, the patient appears comfortable.  O2 saturation has remained in the mid to high 90s on room air and he does not have any respiratory distress.  I did consider whether the patient may require admission given that he initially had hypoxia yesterday afternoon, however, since it has been almost 24 hours since that time and he has remained stable with no further hypoxia throughout my evaluation, is tolerating p.o., and has no respiratory distress, there is no indication for admission at this time.  In addition to azithromycin, prescribed albuterol and prednisone for likely component of bronchitis.  I discussed the results of the work-up and plan of care with the patient.  I gave him  thorough return precautions and he expressed understanding.   FINAL CLINICAL IMPRESSION(S) / ED DIAGNOSES   Final diagnoses:  Community acquired pneumonia, unspecified laterality     Rx / DC Orders   ED Discharge Orders          Ordered    predniSONE (DELTASONE) 20 MG tablet  Daily with breakfast        11/26/21 1339    azithromycin (ZITHROMAX) 250 MG tablet  Daily        11/26/21 1339    albuterol (VENTOLIN HFA) 108 (90 Base) MCG/ACT inhaler  Every 4 hours PRN        11/26/21 1339             Note:  This document was prepared using Dragon voice recognition software and may include unintentional dictation errors.    Arta Silence, MD 11/26/21 1346

## 2021-11-26 NOTE — ED Notes (Signed)
Patient presented to first nurse desk asking about when he might be assigned an ED room.  Patient not listed on ED census, patient had be discharged at [phone] due to failure to answer three calls.  Patient states he has been waiting in the waiting area all night.  AAOx3.  Skin warm and dr y. NAD

## 2021-11-26 NOTE — ED Notes (Signed)
Oxygen tank changed 

## 2021-11-26 NOTE — Discharge Instructions (Signed)
Take the prednisone and the azithromycin starting tomorrow, and continue the full 4-day course of each medicine.  Use the albuterol inhaler up to every 4 hours as needed for shortness of breath or wheezing.  Return to the ER immediately for new, worsening, or persistent severe shortness of breath, wheezing, fever, chest pain, or any other new or worsening symptoms that concern you.

## 2022-05-15 ENCOUNTER — Other Ambulatory Visit: Payer: Self-pay

## 2022-05-15 ENCOUNTER — Encounter: Payer: Self-pay | Admitting: Emergency Medicine

## 2022-05-15 ENCOUNTER — Inpatient Hospital Stay: Payer: Medicare HMO | Admitting: Anesthesiology

## 2022-05-15 ENCOUNTER — Inpatient Hospital Stay
Admission: EM | Admit: 2022-05-15 | Discharge: 2022-05-18 | DRG: 660 | Disposition: A | Discharge status: Home / Self Care | Payer: Medicare HMO | Attending: Internal Medicine | Admitting: Internal Medicine

## 2022-05-15 ENCOUNTER — Encounter
Admission: EM | Disposition: A | Discharge status: Home / Self Care | Payer: Self-pay | Source: Home / Self Care | Attending: Internal Medicine

## 2022-05-15 ENCOUNTER — Inpatient Hospital Stay: Payer: Medicare HMO

## 2022-05-15 ENCOUNTER — Emergency Department: Payer: Medicare HMO

## 2022-05-15 DIAGNOSIS — I129 Hypertensive chronic kidney disease with stage 1 through stage 4 chronic kidney disease, or unspecified chronic kidney disease: Secondary | ICD-10-CM | POA: Diagnosis present

## 2022-05-15 DIAGNOSIS — N400 Enlarged prostate without lower urinary tract symptoms: Secondary | ICD-10-CM | POA: Diagnosis present

## 2022-05-15 DIAGNOSIS — I4891 Unspecified atrial fibrillation: Secondary | ICD-10-CM | POA: Diagnosis present

## 2022-05-15 DIAGNOSIS — Z961 Presence of intraocular lens: Secondary | ICD-10-CM | POA: Diagnosis present

## 2022-05-15 DIAGNOSIS — E669 Obesity, unspecified: Secondary | ICD-10-CM | POA: Diagnosis present

## 2022-05-15 DIAGNOSIS — E785 Hyperlipidemia, unspecified: Secondary | ICD-10-CM | POA: Diagnosis present

## 2022-05-15 DIAGNOSIS — I252 Old myocardial infarction: Secondary | ICD-10-CM

## 2022-05-15 DIAGNOSIS — I10 Essential (primary) hypertension: Secondary | ICD-10-CM | POA: Diagnosis not present

## 2022-05-15 DIAGNOSIS — Z87442 Personal history of urinary calculi: Secondary | ICD-10-CM

## 2022-05-15 DIAGNOSIS — Z7982 Long term (current) use of aspirin: Secondary | ICD-10-CM

## 2022-05-15 DIAGNOSIS — I2510 Atherosclerotic heart disease of native coronary artery without angina pectoris: Secondary | ICD-10-CM | POA: Diagnosis present

## 2022-05-15 DIAGNOSIS — Z7902 Long term (current) use of antithrombotics/antiplatelets: Secondary | ICD-10-CM | POA: Diagnosis not present

## 2022-05-15 DIAGNOSIS — K5900 Constipation, unspecified: Secondary | ICD-10-CM | POA: Diagnosis present

## 2022-05-15 DIAGNOSIS — E1122 Type 2 diabetes mellitus with diabetic chronic kidney disease: Secondary | ICD-10-CM | POA: Diagnosis present

## 2022-05-15 DIAGNOSIS — K589 Irritable bowel syndrome without diarrhea: Secondary | ICD-10-CM | POA: Diagnosis present

## 2022-05-15 DIAGNOSIS — N1831 Chronic kidney disease, stage 3a: Secondary | ICD-10-CM | POA: Diagnosis present

## 2022-05-15 DIAGNOSIS — N201 Calculus of ureter: Principal | ICD-10-CM

## 2022-05-15 DIAGNOSIS — Z6831 Body mass index (BMI) 31.0-31.9, adult: Secondary | ICD-10-CM | POA: Diagnosis not present

## 2022-05-15 DIAGNOSIS — N178 Other acute kidney failure: Secondary | ICD-10-CM | POA: Diagnosis not present

## 2022-05-15 DIAGNOSIS — N132 Hydronephrosis with renal and ureteral calculous obstruction: Secondary | ICD-10-CM | POA: Diagnosis present

## 2022-05-15 DIAGNOSIS — Z9841 Cataract extraction status, right eye: Secondary | ICD-10-CM

## 2022-05-15 DIAGNOSIS — Z79899 Other long term (current) drug therapy: Secondary | ICD-10-CM | POA: Diagnosis not present

## 2022-05-15 DIAGNOSIS — Z951 Presence of aortocoronary bypass graft: Secondary | ICD-10-CM | POA: Diagnosis not present

## 2022-05-15 DIAGNOSIS — Q600 Renal agenesis, unilateral: Secondary | ICD-10-CM | POA: Diagnosis not present

## 2022-05-15 DIAGNOSIS — K219 Gastro-esophageal reflux disease without esophagitis: Secondary | ICD-10-CM | POA: Diagnosis present

## 2022-05-15 DIAGNOSIS — N179 Acute kidney failure, unspecified: Secondary | ICD-10-CM | POA: Diagnosis not present

## 2022-05-15 DIAGNOSIS — Z9842 Cataract extraction status, left eye: Secondary | ICD-10-CM

## 2022-05-15 HISTORY — PX: CYSTOSCOPY/URETEROSCOPY/HOLMIUM LASER/STENT PLACEMENT: SHX6546

## 2022-05-15 LAB — COMPREHENSIVE METABOLIC PANEL
ALT: 23 U/L (ref 0–44)
AST: 27 U/L (ref 15–41)
Albumin: 3.6 g/dL (ref 3.5–5.0)
Alkaline Phosphatase: 77 U/L (ref 38–126)
Anion gap: 9 (ref 5–15)
BUN: 46 mg/dL — ABNORMAL HIGH (ref 8–23)
CO2: 21 mmol/L — ABNORMAL LOW (ref 22–32)
Calcium: 9.1 mg/dL (ref 8.9–10.3)
Chloride: 112 mmol/L — ABNORMAL HIGH (ref 98–111)
Creatinine, Ser: 3 mg/dL — ABNORMAL HIGH (ref 0.61–1.24)
GFR, Estimated: 20 mL/min — ABNORMAL LOW (ref >60)
Glucose, Bld: 118 mg/dL — ABNORMAL HIGH (ref 70–99)
Potassium: 4.1 mmol/L (ref 3.5–5.1)
Sodium: 142 mmol/L (ref 135–145)
Total Bilirubin: 0.6 mg/dL (ref 0.3–1.2)
Total Protein: 7.4 g/dL (ref 6.5–8.1)

## 2022-05-15 LAB — CBC
HCT: 38.4 % — ABNORMAL LOW (ref 39.0–52.0)
Hemoglobin: 12.3 g/dL — ABNORMAL LOW (ref 13.0–17.0)
MCH: 31.7 pg (ref 26.0–34.0)
MCHC: 32 g/dL (ref 30.0–36.0)
MCV: 99 fL (ref 80.0–100.0)
Platelets: 195 10*3/uL (ref 150–400)
RBC: 3.88 MIL/uL — ABNORMAL LOW (ref 4.22–5.81)
RDW: 12.6 % (ref 11.5–15.5)
WBC: 8.5 10*3/uL (ref 4.0–10.5)
nRBC: 0 % (ref 0.0–0.2)

## 2022-05-15 LAB — URINALYSIS, ROUTINE W REFLEX MICROSCOPIC
Bacteria, UA: NONE SEEN
Bilirubin Urine: NEGATIVE
Glucose, UA: NEGATIVE mg/dL
Ketones, ur: NEGATIVE mg/dL
Leukocytes,Ua: NEGATIVE
Nitrite: NEGATIVE
Protein, ur: NEGATIVE mg/dL
Specific Gravity, Urine: 1.012 (ref 1.005–1.030)
Squamous Epithelial / HPF: NONE SEEN (ref 0–5)
pH: 6 (ref 5.0–8.0)

## 2022-05-15 LAB — LIPASE, BLOOD: Lipase: 58 U/L — ABNORMAL HIGH (ref 11–51)

## 2022-05-15 SURGERY — CYSTOSCOPY/URETEROSCOPY/HOLMIUM LASER/STENT PLACEMENT
Anesthesia: General | Site: Ureter | Laterality: Left

## 2022-05-15 MED ORDER — ENOXAPARIN SODIUM 40 MG/0.4ML IJ SOSY
40.0000 mg | PREFILLED_SYRINGE | INTRAMUSCULAR | Status: DC
  Administered 2022-05-15 – 2022-05-17 (×3): 40 mg via SUBCUTANEOUS
  Filled 2022-05-15 (×3): qty 0.4

## 2022-05-15 MED ORDER — SODIUM CHLORIDE 0.9 % IV BOLUS
1000.0000 mL | Freq: Once | INTRAVENOUS | Status: AC
  Administered 2022-05-15: 1000 mL via INTRAVENOUS

## 2022-05-15 MED ORDER — LACTATED RINGERS IV SOLN: INTRAVENOUS | Status: DC

## 2022-05-15 MED ORDER — ONDANSETRON HCL 4 MG/2ML IJ SOLN
INTRAMUSCULAR | Status: DC | PRN
  Administered 2022-05-15: 4 mg via INTRAVENOUS

## 2022-05-15 MED ORDER — STERILE WATER FOR IRRIGATION IR SOLN
Status: DC | PRN
  Administered 2022-05-15: 500 mL

## 2022-05-15 MED ORDER — METOPROLOL TARTRATE 25 MG PO TABS
25.0000 mg | ORAL_TABLET | Freq: Every day | ORAL | Status: DC
  Administered 2022-05-16 – 2022-05-18 (×3): 25 mg via ORAL
  Filled 2022-05-15 (×3): qty 1

## 2022-05-15 MED ORDER — PROPOFOL 10 MG/ML IV BOLUS
INTRAVENOUS | Status: AC
  Filled 2022-05-15: qty 20

## 2022-05-15 MED ORDER — SODIUM CHLORIDE 0.9 % IR SOLN
Status: DC | PRN
  Administered 2022-05-15: 3000 mL via INTRAVESICAL

## 2022-05-15 MED ORDER — PANTOPRAZOLE SODIUM 40 MG PO TBEC
40.0000 mg | DELAYED_RELEASE_TABLET | Freq: Every day | ORAL | Status: DC
  Administered 2022-05-16 – 2022-05-18 (×3): 40 mg via ORAL
  Filled 2022-05-15 (×3): qty 1

## 2022-05-15 MED ORDER — ISOSORBIDE DINITRATE 30 MG PO TABS
30.0000 mg | ORAL_TABLET | Freq: Every day | ORAL | Status: DC
  Administered 2022-05-16 – 2022-05-18 (×3): 30 mg via ORAL
  Filled 2022-05-15 (×4): qty 1

## 2022-05-15 MED ORDER — OXYCODONE HCL 5 MG/5ML PO SOLN: 5.0000 mg | Freq: Once | ORAL | Status: DC | PRN

## 2022-05-15 MED ORDER — CEFAZOLIN SODIUM-DEXTROSE 2-3 GM-%(50ML) IV SOLR
INTRAVENOUS | Status: DC | PRN
  Administered 2022-05-15: 2 g via INTRAVENOUS

## 2022-05-15 MED ORDER — FENTANYL CITRATE (PF) 100 MCG/2ML IJ SOLN
INTRAMUSCULAR | Status: DC | PRN
Start: 2022-05-15 — End: 2022-05-15
  Administered 2022-05-15 (×4): 25 ug via INTRAVENOUS

## 2022-05-15 MED ORDER — FENTANYL CITRATE (PF) 100 MCG/2ML IJ SOLN
INTRAMUSCULAR | Status: AC
  Filled 2022-05-15: qty 2

## 2022-05-15 MED ORDER — ROCURONIUM BROMIDE 10 MG/ML (PF) SYRINGE
PREFILLED_SYRINGE | INTRAVENOUS | Status: AC
  Filled 2022-05-15: qty 10

## 2022-05-15 MED ORDER — OXYCODONE HCL 5 MG PO TABS: 5.0000 mg | ORAL_TABLET | Freq: Once | ORAL | Status: DC | PRN

## 2022-05-15 MED ORDER — SUGAMMADEX SODIUM 200 MG/2ML IV SOLN
INTRAVENOUS | Status: DC | PRN
  Administered 2022-05-15: 200 mg via INTRAVENOUS

## 2022-05-15 MED ORDER — CITALOPRAM HYDROBROMIDE 20 MG PO TABS
40.0000 mg | ORAL_TABLET | Freq: Every day | ORAL | Status: DC
  Administered 2022-05-16 – 2022-05-18 (×3): 40 mg via ORAL
  Filled 2022-05-15 (×3): qty 2

## 2022-05-15 MED ORDER — ACETAMINOPHEN 10 MG/ML IV SOLN
INTRAVENOUS | Status: AC
  Administered 2022-05-15: 1000 mg via INTRAVENOUS
  Filled 2022-05-15: qty 100

## 2022-05-15 MED ORDER — ONDANSETRON HCL 4 MG/2ML IJ SOLN: 4.0000 mg | Freq: Once | INTRAMUSCULAR | Status: DC | PRN

## 2022-05-15 MED ORDER — ALBUTEROL SULFATE (2.5 MG/3ML) 0.083% IN NEBU: 3.0000 mL | INHALATION_SOLUTION | RESPIRATORY_TRACT | Status: DC | PRN

## 2022-05-15 MED ORDER — PROPOFOL 10 MG/ML IV BOLUS
INTRAVENOUS | Status: DC | PRN
  Administered 2022-05-15: 90 mg via INTRAVENOUS

## 2022-05-15 MED ORDER — CEFAZOLIN SODIUM 1 G IJ SOLR
INTRAMUSCULAR | Status: AC
  Filled 2022-05-15: qty 20

## 2022-05-15 MED ORDER — CLOPIDOGREL BISULFATE 75 MG PO TABS
75.0000 mg | ORAL_TABLET | Freq: Every day | ORAL | Status: DC
  Administered 2022-05-16 – 2022-05-18 (×3): 75 mg via ORAL
  Filled 2022-05-15 (×3): qty 1

## 2022-05-15 MED ORDER — FENTANYL CITRATE (PF) 100 MCG/2ML IJ SOLN: 25.0000 ug | INTRAMUSCULAR | Status: DC | PRN

## 2022-05-15 MED ORDER — IOHEXOL 180 MG/ML  SOLN
INTRAMUSCULAR | Status: DC | PRN
  Administered 2022-05-15: 10 mL

## 2022-05-15 MED ORDER — SIMVASTATIN 20 MG PO TABS
40.0000 mg | ORAL_TABLET | Freq: Every day | ORAL | Status: DC
  Administered 2022-05-16 – 2022-05-18 (×3): 40 mg via ORAL
  Filled 2022-05-15 (×3): qty 2

## 2022-05-15 MED ORDER — LIDOCAINE HCL (PF) 2 % IJ SOLN
INTRAMUSCULAR | Status: AC
  Filled 2022-05-15: qty 5

## 2022-05-15 MED ORDER — VENLAFAXINE HCL ER 37.5 MG PO CP24
37.5000 mg | ORAL_CAPSULE | Freq: Every day | ORAL | Status: DC
  Administered 2022-05-16 – 2022-05-18 (×3): 37.5 mg via ORAL
  Filled 2022-05-15 (×4): qty 1

## 2022-05-15 MED ORDER — ROCURONIUM BROMIDE 100 MG/10ML IV SOLN
INTRAVENOUS | Status: DC | PRN
  Administered 2022-05-15: 30 mg via INTRAVENOUS

## 2022-05-15 MED ORDER — ONDANSETRON HCL 4 MG/2ML IJ SOLN
INTRAMUSCULAR | Status: AC
  Filled 2022-05-15: qty 2

## 2022-05-15 MED ORDER — ASPIRIN 81 MG PO TBEC
81.0000 mg | DELAYED_RELEASE_TABLET | Freq: Every day | ORAL | Status: DC
  Administered 2022-05-16 – 2022-05-18 (×3): 81 mg via ORAL
  Filled 2022-05-15 (×3): qty 1

## 2022-05-15 MED ORDER — LIDOCAINE HCL (CARDIAC) PF 100 MG/5ML IV SOSY
PREFILLED_SYRINGE | INTRAVENOUS | Status: DC | PRN
  Administered 2022-05-15: 40 mg via INTRAVENOUS

## 2022-05-15 MED ORDER — ACETAMINOPHEN 10 MG/ML IV SOLN: 1000.0000 mg | Freq: Once | INTRAVENOUS | Status: DC | PRN

## 2022-05-15 SURGICAL SUPPLY — 29 items
ADHESIVE MASTISOL STRL (MISCELLANEOUS) IMPLANT
BAG DRAIN CYSTO-URO LG1000N (MISCELLANEOUS) ×2 IMPLANT
BAG PRESSURE INF REUSE 3000 (BAG) ×2 IMPLANT
BASKET NITINOL 4 WIRE 16 (BASKET) ×1 IMPLANT
BRUSH SCRUB EZ 1% IODOPHOR (MISCELLANEOUS) ×2 IMPLANT
CATH URET FLEX-TIP 2 LUMEN 10F (CATHETERS) IMPLANT
CATH URETL OPEN 5X70 (CATHETERS) IMPLANT
CNTNR SPEC 2.5X3XGRAD LEK (MISCELLANEOUS)
CONT SPEC 4OZ STER OR WHT (MISCELLANEOUS)
CONTAINER SPEC 2.5X3XGRAD LEK (MISCELLANEOUS) IMPLANT
DRAPE UTILITY 15X26 TOWEL STRL (DRAPES) ×2 IMPLANT
DRSG TEGADERM 2-3/8X2-3/4 SM (GAUZE/BANDAGES/DRESSINGS) IMPLANT
FIBER LASER MOSES 200 DFL (Laser) ×1 IMPLANT
GLOVE SURG UNDER POLY LF SZ7.5 (GLOVE) ×2 IMPLANT
GOWN STRL REUS W/ TWL LRG LVL3 (GOWN DISPOSABLE) ×1 IMPLANT
GOWN STRL REUS W/ TWL XL LVL3 (GOWN DISPOSABLE) ×1 IMPLANT
GOWN STRL REUS W/TWL LRG LVL3 (GOWN DISPOSABLE) ×1
GOWN STRL REUS W/TWL XL LVL3 (GOWN DISPOSABLE) ×1
GUIDEWIRE STR DUAL SENSOR (WIRE) ×2 IMPLANT
IV NS IRRIG 3000ML ARTHROMATIC (IV SOLUTION) ×2 IMPLANT
KIT TURNOVER CYSTO (KITS) ×2 IMPLANT
PACK CYSTO AR (MISCELLANEOUS) ×2 IMPLANT
SET CYSTO W/LG BORE CLAMP LF (SET/KITS/TRAYS/PACK) ×2 IMPLANT
SHEATH NAVIGATOR HD 12/14X36 (SHEATH) IMPLANT
STENT URET 6FRX26 CONTOUR (STENTS) ×1 IMPLANT
SURGILUBE 2OZ TUBE FLIPTOP (MISCELLANEOUS) ×2 IMPLANT
SYR 10ML LL (SYRINGE) ×2 IMPLANT
VALVE UROSEAL ADJ ENDO (VALVE) IMPLANT
WATER STERILE IRR 500ML POUR (IV SOLUTION) ×2 IMPLANT

## 2022-05-15 NOTE — ED Notes (Signed)
Informed RN bed assigned 

## 2022-05-15 NOTE — Progress Notes (Signed)
Patient awake/alert x4. Bil hearing aids in place. Patient states he had a kidney stone and drove himself to hospital, in [place] this am Tolerating crackers and gingerale without event.  Of note patient states he just recovered from strep throat two weeks and finished ABX therapy.  To be admitted to hospital to monitor renal function. Cr 3.0

## 2022-05-15 NOTE — Anesthesia Preprocedure Evaluation (Addendum)
Anesthesia Evaluation  Patient identified by MRN, date of birth, ID band Patient awake    Reviewed: Allergy & Precautions, NPO status , Patient's Chart, lab work & pertinent test results  History of Anesthesia Complications Negative for: history of anesthetic complications  Airway Mallampati: IV   Neck ROM: Full    Dental  (+) Missing   Pulmonary neg pulmonary ROS,    Pulmonary exam normal breath sounds clear to auscultation       Cardiovascular hypertension, + CAD (s/p MI, CABG 2001)  Normal cardiovascular exam Rhythm:Regular Rate:Normal  ECG 11/25/21: SR with 1st degree AVB  Echo 12/24/18:  NORMAL LEFT VENTRICULAR SYSTOLIC FUNCTION  WITH MILD LVH  NORMAL RIGHT VENTRICULAR SYSTOLIC FUNCTION  MODERATE VALVULAR REGURGITATION  NO VALVULAR STENOSIS  MODERATE AR  MILD MR, TR, PR  EF 50-55%   Myocardial perfusion 12/24/18:  Normal treadmill EKG without evidence of ischemia or arrhythmia  Normal myocardial perfusion without evidence of myocardial ischemia    Neuro/Psych negative neurological ROS     GI/Hepatic GERD  ,  Endo/Other  Obesity   Renal/GU Renal disease (stage III CKD; congenital singular kidney)     Musculoskeletal   Abdominal   Peds  Hematology negative hematology ROS (+)   Anesthesia Other Findings   Reproductive/Obstetrics                            Anesthesia Physical Anesthesia Plan  ASA: 3  Anesthesia Plan: General   Post-op Pain Management:    Induction: Intravenous  PONV Risk Score and Plan: 2 and Ondansetron, Dexamethasone and Treatment may vary due to age or medical condition  Airway Management Planned: Oral ETT  Additional Equipment:   Intra-op Plan:   Post-operative Plan: Extubation in OR  Informed Consent: I have reviewed the patients History and Physical, chart, labs and discussed the procedure including the risks, benefits and alternatives for the  proposed anesthesia with the patient or authorized representative who has indicated his/her understanding and acceptance.     Dental advisory given  Plan Discussed with: CRNA  Anesthesia Plan Comments: (Patient consented for risks of anesthesia including but not limited to:  - adverse reactions to medications - damage to eyes, teeth, lips or other oral mucosa - nerve damage due to positioning  - sore throat or hoarseness - damage to heart, brain, nerves, lungs, other parts of body or loss of life  Informed patient about role of CRNA in peri- and intra-operative care.  Patient voiced understanding.)       Anesthesia Quick Evaluation

## 2022-05-15 NOTE — ED Notes (Signed)
Pt transported to CT via stretcher.  

## 2022-05-15 NOTE — ED Triage Notes (Addendum)
Pt with left flank pain x2 days with no urinary symptoms. Hx/o kidney stones. Pt denies N/V.

## 2022-05-15 NOTE — Op Note (Signed)
Date of procedure: 05/15/22  Preoperative diagnosis:  Left ureteral stone Acute kidney injury  Postoperative diagnosis:  Same  Procedure: Cystoscopy, left ureteroscopy, laser lithotripsy, basket extraction of stone fragments, left retrograde pyelogram with intraoperative interpretation, left ureteral stent placement  Surgeon: Nickolas Madrid, MD  Anesthesia: General  Complications: None  Intraoperative findings:  Normal urethra, prostate, and bladder Uncomplicated fragmentation of 6 mm mid ureteral stone, larger fragments basket extracted Uncomplicated stent placement  EBL: Minimal  Specimens: None  Drains: Left 6 French by 26 cm ureteral stent  Indication: Jonathan Blankenship is a 81 y.o. patient with atrophic right kidney who presented with left-sided flank pain and CT showing 6 mm left mid ureteral stone with hydronephrosis and AKI.  After reviewing the management options for treatment, they elected to proceed with the above surgical procedure(s). We have discussed the potential benefits and risks of the procedure, side effects of the proposed treatment, the likelihood of the patient achieving the goals of the procedure, and any potential problems that might occur during the procedure or recuperation. Informed consent has been obtained.  Description of procedure:  The patient was taken to the operating room and general anesthesia was induced. SCDs were placed for DVT prophylaxis. The patient was placed in the dorsal lithotomy position, prepped and draped in the usual sterile fashion, and preoperative antibiotics(Ancef) were administered. A preoperative time-out was performed.   A 21 French rigid cystoscope was used to intubate the urethra and a normal-appearing urethra was followed proximally into the bladder.  The prostate was small with an open channel.  Thorough cystoscopy showed no suspicious lesions.  With the aid of a 5 [place] access catheter I was able to advance a sensor wire  to the left ureteral orifice and up to the kidney under fluoroscopic vision.  A semirigid ureteroscope was advanced alongside the wire and there was an impacted yellow stone in the mid ureter.  A 200 m laser fiber on settings of 1.0 J and 10 Hz was used to methodically fragment the stone into smaller fragments.  A basket was then brought into remove the larger fragments, and the rest were irrigated free from the ureter.  The scope was advanced all the way to the proximal ureter and a retrograde pyelogram showed no extravasation or filling defects.  Pullback ureteroscopy showed no residual fragments or injury, and there was some moderate erythema and edema where the stone had been lodged.  The rigid cystoscope was backloaded over the wire and a 6 [place] by 26 cm ureteral stent was uneventfully placed with a curl in the upper pole, as well as under direct vision in the bladder.  The bladder was drained and this concluded our procedure.  Disposition: Stable to PACU  Plan: Admit to hospitalist, re-check renal function in the morning Follow-up in clinic for stent removal in 1 to 2 weeks  Nickolas Madrid, MD

## 2022-05-15 NOTE — ED Notes (Signed)
Pt transported to OR with OR staff. Family friend, Lynden Oxford, [phone]) would like to be called with updates.

## 2022-05-15 NOTE — Transfer of Care (Signed)
Immediate Anesthesia Transfer of Care Note  Patient: Jonathan Blankenship  Procedure(s) Performed: CYSTOSCOPY/URETEROSCOPY/HOLMIUM LASER/STENT PLACEMENT (Left: Ureter)  Patient Location: PACU  Anesthesia Type:General  Level of Consciousness: awake  Airway & Oxygen Therapy: Patient Spontanous Breathing and Patient connected to nasal cannula oxygen  Post-op Assessment: Report given to RN and Post -op Vital signs reviewed and stable  Post vital signs: Reviewed and stable  Last Vitals:  Vitals Value Taken Time  BP 146/67 05/15/22 1300  Temp 35.9 1300  Pulse 76 05/15/22 1303  Resp 17 05/15/22 1303  SpO2 97 % 05/15/22 1303  Vitals shown include unvalidated device data.  Last Pain:  Vitals:   05/15/22 1151  TempSrc: Oral  PainSc:          Complications: No notable events documented.

## 2022-05-15 NOTE — ED Notes (Signed)
Urology and hospitalist at bedside assessing pt.

## 2022-05-15 NOTE — ED Notes (Signed)
Report given to Same Day RN. All questions answered. Pt reports last food/drink was last night around 1900. Pt changed into gown. Belongings given to friend at bedside. Informed consent signed and at bedside.

## 2022-05-15 NOTE — ED Provider Notes (Signed)
[facility] Provider Note    Event Date/Time   First MD Initiated Contact with Patient 05/15/22 [phone]     (approximate)   History   Flank Pain   HPI  Jonathan Blankenship is a 81 y.o. male with a history of hypertension, CAD status post CABG, atrial fibrillation, BPH, GERD, diabetes, hyperlipidemia, IBS, and chronic kidney disease who presents with left flank pain, acute onset 2 days ago, persistent course since then, nonradiating.  It is not associated with nausea or vomiting.  The patient denies any urinary symptoms.  He states that he felt very thirsty yesterday but states he has been drinking fluids.  He states he has had kidney stones in the past and the pain feels similar.    Physical Exam   Triage Vital Signs: ED Triage Vitals [05/15/22 0636]  Enc Vitals Group     BP (!) 144/86     Pulse Rate 68     Resp 16     Temp 97.8 F (36.6 C)     Temp Source Oral     SpO2 98 %     Weight 212 lb 11.9 oz (96.5 kg)     Height 5\' 9"  (1.753 m)     Head Circumference      Peak Flow      Pain Score      Pain Loc      Pain Edu?      Excl. in [place]?     Most recent vital signs: Vitals:   05/15/22 0900 05/15/22 0930  BP: (!) 144/55 (!) 153/66  Pulse: (!) 59 (!) 59  Resp: 16 16  Temp:    SpO2: 98% 100%     General: Alert and oriented, well-appearing. CV:  Good peripheral perfusion.  Resp:  Normal effort.  Abd:  Soft and nontender.  No distention.  Other:  No CVA tenderness.   ED Results / Procedures / Treatments   Labs (all labs ordered are listed, but only abnormal results are displayed) Labs Reviewed  LIPASE, BLOOD - Abnormal; Notable for the following components:      Result Value   Lipase 58 (*)    All other components within normal limits  COMPREHENSIVE METABOLIC PANEL - Abnormal; Notable for the following components:   Chloride 112 (*)    CO2 21 (*)    Glucose, Bld 118 (*)    BUN 46 (*)    Creatinine, Ser 3.00 (*)    GFR, Estimated 20  (*)    All other components within normal limits  CBC - Abnormal; Notable for the following components:   RBC 3.88 (*)    Hemoglobin 12.3 (*)    HCT 38.4 (*)    All other components within normal limits  URINALYSIS, ROUTINE W REFLEX MICROSCOPIC     EKG     RADIOLOGY  CT abdomen/pelvis: I independently viewed and interpreted the images; there is a 6 mm left ureteral stone with hydronephrosis  PROCEDURES:  Critical Care performed: No  Procedures   MEDICATIONS ORDERED IN ED: Medications  sodium chloride 0.9 % bolus 1,000 mL (1,000 mLs Intravenous New Bag/Given 05/15/22 0819)     IMPRESSION / MDM / ASSESSMENT AND PLAN / ED COURSE  I reviewed the triage vital signs and the nursing notes.  81 year old male with PMH as noted above presents with left flank pain over the last couple of days with no significant associated symptoms.  I reviewed the past medical records.  The patient was seen by Dr. Tiajuana Amass from nephrology in January for AKI in the context of CKD stage III AAA and has been treated with losartan.  Differential diagnosis includes, but is not limited to, ureteral stone, pyelonephritis, musculoskeletal pain, diverticulitis.  Patient's presentation is most consistent with acute presentation with potential threat to life or bodily function.  Initial lab work-up is significant for a creatinine of 3.0.  The patient's most recent labs from early June showed creatinine of 1.3 at that time.  There is no leukocytosis.  ----------------------------------------- 9:52 AM on 05/15/2022 -----------------------------------------  CT shows a large left ureteral stone.  The patient has an atrophic right kidney so this is likely causing his AKI.  I consulted Dr. Diamantina Providence from urology who advised that he will evaluate the patient today for possible procedure later this afternoon and to keep him n.p.o.  Given the AKI, I consulted Dr. Roosevelt Locks from the hospitalist service; based on our  discussion he agrees to admit the patient.   FINAL CLINICAL IMPRESSION(S) / ED DIAGNOSES   Final diagnoses:  Ureteral stone  Acute kidney injury ([name])     Rx / DC Orders   ED Discharge Orders     None        Note:  This document was prepared using Dragon voice recognition software and may include unintentional dictation errors.    Arta Silence, MD 05/15/22 [phone]

## 2022-05-15 NOTE — Anesthesia Postprocedure Evaluation (Signed)
Anesthesia Post Note  Patient: Jonathan Blankenship  Procedure(s) Performed: CYSTOSCOPY/URETEROSCOPY/HOLMIUM LASER/STENT PLACEMENT (Left: Ureter)  Patient location during evaluation: PACU Anesthesia Type: General Level of consciousness: awake and alert, oriented and patient cooperative Pain management: pain level controlled Vital Signs Assessment: post-procedure vital signs reviewed and stable Respiratory status: spontaneous breathing, nonlabored ventilation and respiratory function stable Cardiovascular status: blood pressure returned to baseline and stable Postop Assessment: adequate PO intake Anesthetic complications: no   No notable events documented.   Last Vitals:  Vitals:   05/15/22 1315 05/15/22 1333  BP: 125/84 129/64  Pulse: 64 71  Resp: 17 19  Temp:  36.5 C  SpO2: 100% 100%    Last Pain:  Vitals:   05/15/22 1333  TempSrc:   PainSc: 2                  Darrin Nipper

## 2022-05-15 NOTE — ED Notes (Signed)
Belongings sent to OR with staff to be locked up in their locker.

## 2022-05-15 NOTE — H&P (Signed)
History and Physical    Patient: Jonathan Blankenship DOB: [birth year] DOA: 05/15/2022 DOS: the patient was seen and examined on 05/15/2022 PCP: Derinda Late, MD  Patient coming from: Home, he is independent of all ADLs.  Chief Complaint:  Chief Complaint  Patient presents with   Flank Pain   HPI: Jonathan Blankenship is a 81 y.o. male with medical history significant of coronary artery disease, congenital singular kidney, essential hypertension, dyslipidemia, gastroesophageal reflux disease who present to the hospital with a left flank pain that radiated to the groin.  CT scan showed obstructing kidney stone.  He was also found to have acute kidney injury with creatinine of 3.0.  Urology consult was obtained from the emergency room, Dr. Diamantina Providence will bring patient to the OR today.  Patient has some mild nausea, otherwise no symptoms Review of Systems: As mentioned in the history of present illness. All other systems reviewed and are negative. Past Medical History:  Diagnosis Date   Aortic insufficiency    Coronary artery disease    GERD (gastroesophageal reflux disease)    Hyperlipidemia    Hypertension    Myocardial infarction ([place]) 07/2000   Past Surgical History:  Procedure Laterality Date   CARDIAC CATHETERIZATION     08/08, 02/28/12, 03/29/12(2 stents)   CATARACT EXTRACTION W/PHACO Left 03/28/2017   Procedure: CATARACT EXTRACTION PHACO AND INTRAOCULAR LENS PLACEMENT ([place]) LEFT;  Surgeon: Leandrew Koyanagi, MD;  Location: [facility];  Service: Ophthalmology;  Laterality: Left;  IVA BLOCK LEFT   CATARACT EXTRACTION W/PHACO Right 04/18/2017   Procedure: CATARACT EXTRACTION PHACO AND INTRAOCULAR LENS PLACEMENT ([place]) Right IVA BLOCK;  Surgeon: Leandrew Koyanagi, MD;  Location: [facility];  Service: Ophthalmology;  Laterality: Right;  IVA BLOCK   COLONOSCOPY  08/03/2005   CORONARY ARTERY BYPASS GRAFT  07/23/2000   1 vessel   ESOPHAGOGASTRODUODENOSCOPY   01/08/2006   EXPLORATORY LAPAROTOMY     Social History:  reports that he has never smoked. He has never used smokeless tobacco. He reports that he does not drink alcohol. No history on file for drug use.  No Known Allergies  History reviewed. No pertinent family history.  Prior to Admission medications   Medication Sig Start Date End Date Taking? Authorizing Provider  albuterol (VENTOLIN HFA) 108 (90 Base) MCG/ACT inhaler Inhale 2 puffs into the lungs every 4 (four) hours as needed for wheezing or shortness of breath. 11/26/21  Yes Arta Silence, MD  aspirin EC 81 MG tablet Take 81 mg by mouth daily.   Yes [provider]  citalopram (CELEXA) 40 MG tablet Take 40 mg by mouth daily.   Yes [provider]  clopidogrel (PLAVIX) 75 MG tablet Take 75 mg by mouth daily.   Yes [provider]  isosorbide dinitrate (ISORDIL) 30 MG tablet Take 30 mg by mouth daily.   Yes [provider]  losartan (COZAAR) 100 MG tablet Take 100 mg by mouth daily. 11/01/21  Yes [provider]  metoprolol tartrate (LOPRESSOR) 25 MG tablet Take 25 mg by mouth daily.   Yes [provider]  omeprazole (PRILOSEC) 20 MG capsule Take 20 mg by mouth daily.   Yes [provider]  simvastatin (ZOCOR) 40 MG tablet Take 40 mg by mouth daily.   Yes [provider]  venlafaxine XR (EFFEXOR-XR) 37.5 MG 24 hr capsule Take 37.5 mg by mouth daily. 09/17/21  Yes [provider]    Physical Exam: Vitals:   05/15/22 0815 05/15/22 0900  05/15/22 0930 05/15/22 1000  BP: (!) 166/66 (!) 144/55 (!) 153/66 (!) 146/62  Pulse: 62 (!) 59 (!) 59 (!) 57  Resp: 14 16 16    Temp:      TempSrc:      SpO2: 100% 98% 100% 100%  Weight:      Height:       Physical Exam Vitals and nursing note reviewed.  Constitutional:      General: He is not in acute distress.    Appearance: He is not ill-appearing or toxic-appearing.  HENT:     Head: Normocephalic and  atraumatic.     Nose: Nose normal.     Mouth/Throat:     Mouth: Mucous membranes are moist.  Eyes:     Extraocular Movements: Extraocular movements intact.     Conjunctiva/sclera: Conjunctivae normal.     Pupils: Pupils are equal, round, and reactive to light.  Cardiovascular:     Rate and Rhythm: Normal rate and regular rhythm.     Heart sounds: No murmur heard.    No gallop.  Pulmonary:     Effort: Pulmonary effort is normal. No respiratory distress.     Breath sounds: Normal breath sounds.  Abdominal:     General: Abdomen is flat. Bowel sounds are normal. There is no distension.     Palpations: Abdomen is soft.     Tenderness: There is no abdominal tenderness.  Musculoskeletal:        General: No swelling or tenderness. Normal range of motion.     Cervical back: Normal range of motion and neck supple. No rigidity or tenderness.  Lymphadenopathy:     Cervical: No cervical adenopathy.  Skin:    General: Skin is warm and dry.  Neurological:     General: No focal deficit present.     Mental Status: He is alert and oriented to person, place, and time.     Cranial Nerves: No cranial nerve deficit.  Psychiatric:        Mood and Affect: Mood normal.        Behavior: Behavior normal.     Data Reviewed:  Reviewed the CT scan, reviewed all lab results.  Assessment and Plan: Acute kidney injury secondary to obstructing kidney stone. Hydronephrosis with obstructing kidney stone Solitary kidney. Patient condition is caused by kidney stone, seen by urology, scheduled for surgery this afternoon. No evidence of UTI, will continue IV fluids. Recheck renal function tomorrow.  Coronary artery disease  Stable, continue home medicines.  Essential hypertension. Hold up ARB, continue beta-blocker.    Advance Care Planning:   Code Status: Full Code Full  Consults: Urology  Family Communication: None at bedside  Severity of Illness: The appropriate patient status for this  patient is INPATIENT. Inpatient status is judged to be reasonable and necessary in order to provide the required intensity of service to ensure the patient's safety. The patient's presenting symptoms, physical exam findings, and initial radiographic and laboratory data in the context of their chronic comorbidities is felt to place them at high risk for further clinical deterioration. Furthermore, it is not anticipated that the patient will be medically stable for discharge from the hospital within 2 midnights of admission.   * I certify that at the point of admission it is my clinical judgment that the patient will require inpatient hospital care spanning beyond 2 midnights from the point of admission due to high intensity of service, high risk for further deterioration and high frequency of surveillance required.*  Author: Sharen Hones, MD 05/15/2022 10:37 AM  For on call review [email or web address].

## 2022-05-15 NOTE — Anesthesia Procedure Notes (Signed)
Procedure Name: Intubation Date/Time: 05/15/2022 12:13 PM  Performed by: Cammie Sickle, CRNAPre-anesthesia Checklist: Patient identified, Patient being monitored, Timeout performed, Emergency Drugs available and Suction available Patient Re-evaluated:Patient Re-evaluated prior to induction Oxygen Delivery Method: Circle system utilized Preoxygenation: Pre-oxygenation with 100% oxygen Induction Type: IV induction Ventilation: Mask ventilation without difficulty Laryngoscope Size: 3 and McGraph Grade View: Grade I Tube type: Oral Tube size: 7.0 mm Number of attempts: 1 Airway Equipment and Method: Stylet Placement Confirmation: ETT inserted through vocal cords under direct vision, positive ETCO2 and breath sounds checked- equal and bilateral Secured at: 22 cm Tube secured with: Tape Dental Injury: Teeth and Oropharynx as per pre-operative assessment

## 2022-05-15 NOTE — Consult Note (Signed)
Urology Consult  I have been asked to see the patient by Dr. Roosevelt Locks, for evaluation and management of left ureteral stone, AKI.  Chief Complaint: Left flank pain, nausea  History of Present Illness: Jonathan Blankenship is a 81 y.o. year old male with a history of atrophic right kidney and nephrolithiasis who presented to the ED this morning with reports of 2 days of left flank pain and nausea.  Admission labs notable for UA without nitrites, pyuria, microscopic hematuria, or bacteriuria; WBC count stable at 8.5; creatinine 3.00, baseline 1.2.  CT stone study revealed an atrophic right kidney with several nonobstructing stones as well as left hydroureteronephrosis to the level of a 6 mm proximal left ureteral stone.  Today he reports a remote history of kidney stones.  He states he has always passed his stones spontaneously and not required procedures to treat them.  He has not seen a urologist in many years but reports an open bladder surgery with Dr. Ernst Spell in 1983 because "the stem of my bladder grew over."  He has been n.p.o. since 8 PM last night.  Past Medical History:  Diagnosis Date   Aortic insufficiency    Coronary artery disease    GERD (gastroesophageal reflux disease)    Hyperlipidemia    Hypertension    Myocardial infarction ([place]) 07/2000    Past Surgical History:  Procedure Laterality Date   CARDIAC CATHETERIZATION     08/08, 02/28/12, 03/29/12(2 stents)   CATARACT EXTRACTION W/PHACO Left 03/28/2017   Procedure: CATARACT EXTRACTION PHACO AND INTRAOCULAR LENS PLACEMENT ([place]) LEFT;  Surgeon: Leandrew Koyanagi, MD;  Location: [facility];  Service: Ophthalmology;  Laterality: Left;  IVA BLOCK LEFT   CATARACT EXTRACTION W/PHACO Right 04/18/2017   Procedure: CATARACT EXTRACTION PHACO AND INTRAOCULAR LENS PLACEMENT ([place]) Right IVA BLOCK;  Surgeon: Leandrew Koyanagi, MD;  Location: [facility];  Service: Ophthalmology;  Laterality: Right;  IVA BLOCK    COLONOSCOPY  08/03/2005   CORONARY ARTERY BYPASS GRAFT  07/23/2000   1 vessel   ESOPHAGOGASTRODUODENOSCOPY  01/08/2006   EXPLORATORY LAPAROTOMY      Home Medications:  Current Meds  Medication Sig   albuterol (VENTOLIN HFA) 108 (90 Base) MCG/ACT inhaler Inhale 2 puffs into the lungs every 4 (four) hours as needed for wheezing or shortness of breath.   aspirin EC 81 MG tablet Take 81 mg by mouth daily.   citalopram (CELEXA) 40 MG tablet Take 40 mg by mouth daily.   clopidogrel (PLAVIX) 75 MG tablet Take 75 mg by mouth daily.   isosorbide dinitrate (ISORDIL) 30 MG tablet Take 30 mg by mouth daily.   losartan (COZAAR) 100 MG tablet Take 100 mg by mouth daily.   metoprolol tartrate (LOPRESSOR) 25 MG tablet Take 25 mg by mouth daily.   omeprazole (PRILOSEC) 20 MG capsule Take 20 mg by mouth daily.   simvastatin (ZOCOR) 40 MG tablet Take 40 mg by mouth daily.   venlafaxine XR (EFFEXOR-XR) 37.5 MG 24 hr capsule Take 37.5 mg by mouth daily.    Allergies: No Known Allergies  History reviewed. No pertinent family history.  Social History:  reports that he has never smoked. He has never used smokeless tobacco. He reports that he does not drink alcohol. No history on file for drug use.  ROS: A complete review of systems was performed.  All systems are negative except for pertinent findings as noted.  Physical Exam:  Vital signs in last 24 hours: Temp:  [97.8 F (36.6  C)] 97.8 F (36.6 C) (07/03 0636) Pulse Rate:  [57-68] 57 (07/03 1000) Resp:  [14-16] 16 (07/03 0930) BP: (144-166)/(55-86) 146/62 (07/03 1000) SpO2:  [98 %-100 %] 100 % (07/03 1000) Weight:  [96.5 kg] 96.5 kg (07/03 0636) Constitutional:  Alert and oriented, no acute distress HEENT: [place] AT, moist mucus membranes Cardiovascular: No clubbing, cyanosis, or edema Respiratory: Normal respiratory effort Skin: No rashes, bruises or suspicious lesions Neurologic: Grossly intact, no focal deficits, moving all 4  extremities Psychiatric: Normal mood and affect  Laboratory Data:  Recent Labs    05/15/22 0638  WBC 8.5  HGB 12.3*  HCT 38.4*   Recent Labs    05/15/22 0638  NA 142  K 4.1  CL 112*  CO2 21*  GLUCOSE 118*  BUN 46*  CREATININE 3.00*  CALCIUM 9.1   Urinalysis    Component Value Date/Time   COLORURINE STRAW (A) 05/15/2022 1003   APPEARANCEUR CLEAR (A) 05/15/2022 1003   APPEARANCEUR Clear 02/12/2015 1616   LABSPEC 1.012 05/15/2022 1003   LABSPEC 1.026 02/12/2015 1616   PHURINE 6.0 05/15/2022 1003   GLUCOSEU NEGATIVE 05/15/2022 1003   GLUCOSEU Negative 02/12/2015 1616   HGBUR SMALL (A) 05/15/2022 1003   BILIRUBINUR NEGATIVE 05/15/2022 1003   BILIRUBINUR Negative 02/12/2015 1616   KETONESUR NEGATIVE 05/15/2022 1003   PROTEINUR NEGATIVE 05/15/2022 1003   NITRITE NEGATIVE 05/15/2022 1003   LEUKOCYTESUR NEGATIVE 05/15/2022 1003   LEUKOCYTESUR Negative 02/12/2015 1616   Results for orders placed or performed during the hospital encounter of 11/26/21  Resp Panel by RT-PCR (Flu A&B, Covid) Nasopharyngeal Swab     Status: None   Collection Time: 11/26/21  1:42 AM   Specimen: Nasopharyngeal Swab; Nasopharyngeal(NP) swabs in vial transport medium  Result Value Ref Range Status   SARS Coronavirus 2 by RT PCR NEGATIVE NEGATIVE Final    Comment: (NOTE) SARS-CoV-2 target nucleic acids are NOT DETECTED.  The SARS-CoV-2 RNA is generally detectable in upper respiratory specimens during the acute phase of infection. The lowest concentration of SARS-CoV-2 viral copies this assay can detect is 138 copies/mL. A negative result does not preclude SARS-Cov-2 infection and should not be used as the sole basis for treatment or other patient management decisions. A negative result may occur with  improper specimen collection/handling, submission of specimen other than nasopharyngeal swab, presence of viral mutation(s) within the areas targeted by this assay, and inadequate number of  viral copies(<138 copies/mL). A negative result must be combined with clinical observations, patient history, and epidemiological information. The expected result is Negative.  Fact Sheet for Patients:  EntrepreneurPulse.com.au  Fact Sheet for Healthcare Providers:  IncredibleEmployment.be  This test is no t yet approved or cleared by the [place] FDA and  has been authorized for detection and/or diagnosis of SARS-CoV-2 by FDA under an Emergency Use Authorization (EUA). This EUA will remain  in effect (meaning this test can be used) for the duration of the COVID-19 declaration under Section 564(b)(1) of the Act, 21 U.S.C.section 360bbb-3(b)(1), unless the authorization is terminated  or revoked sooner.       Influenza A by PCR NEGATIVE NEGATIVE Final   Influenza B by PCR NEGATIVE NEGATIVE Final    Comment: (NOTE) The Xpert Xpress SARS-CoV-2/FLU/RSV plus assay is intended as an aid in the diagnosis of influenza from Nasopharyngeal swab specimens and should not be used as a sole basis for treatment. Nasal washings and aspirates are unacceptable for Xpert Xpress SARS-CoV-2/FLU/RSV testing.  Fact Sheet for Patients: EntrepreneurPulse.com.au  Fact Sheet for Healthcare Providers: IncredibleEmployment.be  This test is not yet approved or cleared by the [place] FDA and has been authorized for detection and/or diagnosis of SARS-CoV-2 by FDA under an Emergency Use Authorization (EUA). This EUA will remain in effect (meaning this test can be used) for the duration of the COVID-19 declaration under Section 564(b)(1) of the Act, 21 U.S.C. section 360bbb-3(b)(1), unless the authorization is terminated or revoked.  Performed at [facility], [address] [place], [place] [address]     Radiologic Imaging: CT Renal Stone Study  Result Date: 05/15/2022 CLINICAL DATA:  Flank pain.  Evaluate  for kidney stone. EXAM: CT ABDOMEN AND PELVIS WITHOUT CONTRAST TECHNIQUE: Multidetector CT imaging of the abdomen and pelvis was performed following the standard protocol without IV contrast. RADIATION DOSE REDUCTION: This exam was performed according to the departmental dose-optimization program which includes automated exposure control, adjustment of the mA and/or kV according to patient size and/or use of iterative reconstruction technique. COMPARISON:  CT chest 11/26/2021. FINDINGS: Lower chest: Mild subpleural reticulation noted within the lung bases. No acute abnormality Hepatobiliary: No focal liver abnormality is seen. No gallstones, gallbladder wall thickening, or biliary dilatation. Pancreas: Unremarkable. No pancreatic ductal dilatation or surrounding inflammatory changes. Spleen: Normal in size without focal abnormality. Adrenals/Urinary Tract: Normal adrenal glands. Right renal atrophy. Cortical calcifications identified within the inferior pole of the right kidney. Within the interpolar collecting system of the right kidney there is a nonobstructing stone measuring 3 mm, image 47/2. There is asymmetric left hydronephrosis and hydroureter. Stone within the left mid ureter at the level of the aortic bifurcation measures 6 mm, image 63/2. The urinary bladder appears normal. Stomach/Bowel: Small hiatal hernia. The appendix is not confidently identified. No secondary signs of acute appendicitis. No bowel wall thickening, inflammation, or distension. Vascular/Lymphatic: Aortic atherosclerosis. No signs of abdominopelvic adenopathy. Reproductive: Prostate is unremarkable. Other: No free fluid or fluid collections. No signs of pneumoperitoneum. Musculoskeletal: No acute or significant osseous findings. 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 IMPRESSION: 1. Left-sided hydronephrosis and hydroureter secondary to 6 mm left mid ureteral calculus. 2. Nonobstructing right renal calculus. 3. Small hiatal hernia. 4.  Aortic Atherosclerosis (ICD10-I70.0). Electronically Signed   By: Kerby Moors M.D.   On: 05/15/2022 08:07    Assessment & Plan:  81 year old male with a history of atrophic right kidney admitted with AKI and left renal colic secondary to a 6 mm obstructing proximal left ureteral stone.  History of remote open bladder surgery with Dr. Ernst Spell per patient report, notes unavailable for review.  No evidence of urinary infection today.  With his likely minimal right renal function, we recommend proceeding with left ureteroscopy with laser lithotripsy and stent placement today.  He will remain admitted overnight for supportive care for his AKI.  Patient is in agreement with this plan.  We discussed stent symptoms including flank pain, dysuria, urgency, frequency, and gross hematuria.  He expressed understanding.  Left side marked and consent orders placed.  Please keep n.p.o. in advance of procedure.  Thank you for involving me in this patient's care, I will continue to follow along.  Debroah Loop, PA-C 05/15/2022 10:49 AM

## 2022-05-16 ENCOUNTER — Encounter: Payer: Self-pay | Admitting: Urology

## 2022-05-16 DIAGNOSIS — N178 Other acute kidney failure: Secondary | ICD-10-CM | POA: Diagnosis not present

## 2022-05-16 DIAGNOSIS — I10 Essential (primary) hypertension: Secondary | ICD-10-CM | POA: Diagnosis not present

## 2022-05-16 DIAGNOSIS — N132 Hydronephrosis with renal and ureteral calculous obstruction: Secondary | ICD-10-CM | POA: Diagnosis not present

## 2022-05-16 LAB — CBC
HCT: 32.9 % — ABNORMAL LOW (ref 39.0–52.0)
Hemoglobin: 10.7 g/dL — ABNORMAL LOW (ref 13.0–17.0)
MCH: 32.1 pg (ref 26.0–34.0)
MCHC: 32.5 g/dL (ref 30.0–36.0)
MCV: 98.8 fL (ref 80.0–100.0)
Platelets: 160 10*3/uL (ref 150–400)
RBC: 3.33 MIL/uL — ABNORMAL LOW (ref 4.22–5.81)
RDW: 12.6 % (ref 11.5–15.5)
WBC: 6.9 10*3/uL (ref 4.0–10.5)
nRBC: 0 % (ref 0.0–0.2)

## 2022-05-16 LAB — BASIC METABOLIC PANEL
Anion gap: 4 — ABNORMAL LOW (ref 5–15)
BUN: 39 mg/dL — ABNORMAL HIGH (ref 8–23)
CO2: 21 mmol/L — ABNORMAL LOW (ref 22–32)
Calcium: 8.2 mg/dL — ABNORMAL LOW (ref 8.9–10.3)
Chloride: 118 mmol/L — ABNORMAL HIGH (ref 98–111)
Creatinine, Ser: 2.29 mg/dL — ABNORMAL HIGH (ref 0.61–1.24)
GFR, Estimated: 28 mL/min — ABNORMAL LOW (ref >60)
Glucose, Bld: 111 mg/dL — ABNORMAL HIGH (ref 70–99)
Potassium: 4.1 mmol/L (ref 3.5–5.1)
Sodium: 143 mmol/L (ref 135–145)

## 2022-05-16 LAB — MAGNESIUM: Magnesium: 2 mg/dL (ref 1.7–2.4)

## 2022-05-16 MED ORDER — ACETAMINOPHEN 325 MG PO TABS
650.0000 mg | ORAL_TABLET | Freq: Four times a day (QID) | ORAL | Status: DC | PRN
Start: 2022-05-16 — End: 2022-05-18
  Administered 2022-05-16 – 2022-05-17 (×3): 650 mg via ORAL
  Filled 2022-05-16 (×3): qty 2

## 2022-05-16 NOTE — Hospital Course (Signed)
Jonathan Blankenship is a 81 y.o. male with medical history significant of coronary artery disease, congenital singular kidney, essential hypertension, dyslipidemia, gastroesophageal reflux disease who present to the hospital with a left flank pain that radiated to the groin.  CT scan showed obstructing kidney stone.  He was also found to have acute kidney injury with creatinine of 3.0.  Patient had a ureteral stent placed on 7/3.

## 2022-05-16 NOTE — Progress Notes (Signed)
  Progress Note   Patient: Jonathan Blankenship DOB: 1941/04/30 DOA: 05/15/2022     1 DOS: the patient was seen and examined on 05/16/2022   Brief hospital course: Jonathan Blankenship is a 81 y.o. male with medical history significant of coronary artery disease, congenital singular kidney, essential hypertension, dyslipidemia, gastroesophageal reflux disease who present to the hospital with a left flank pain that radiated to the groin.  CT scan showed obstructing kidney stone.  He was also found to have acute kidney injury with creatinine of 3.0.  Patient had a ureteral stent placed on 7/3.  Assessment and Plan: Acute kidney injury secondary to obstructing kidney stone. Hydronephrosis with obstructing kidney stone Solitary kidney. Status post urinary stent. Condition is improving, but renal function still not at baseline.  We will continue IV fluids for today.  May be able to discharge tomorrow if renal function improves.  Coronary artery disease  no angina.  Continue home medicines.  Essential hypertension. Hold up ARB, continue beta-blocker.       Subjective:  Patient doing well, no abdominal pain or nausea vomiting.  Physical Exam: Vitals:   05/15/22 2052 05/16/22 0506 05/16/22 0806 05/16/22 0809  BP: 122/60 133/64 (!) 151/67 (!) 150/68  Pulse: 68 72 69 70  Resp: 16 16 20 20   Temp: 98.2 F (36.8 C) 98.4 F (36.9 C) 99.2 F (37.3 C)   TempSrc:  Oral    SpO2: 95% 92% 96% 95%  Weight:      Height:       General exam: Appears calm and comfortable  Respiratory system: Clear to auscultation. Respiratory effort normal. Cardiovascular system: S1 & S2 heard, RRR. No JVD, murmurs, rubs, gallops or clicks. No pedal edema. Gastrointestinal system: Abdomen is nondistended, soft and nontender. No organomegaly or masses felt. Normal bowel sounds heard. Central nervous system: Alert and oriented. No focal neurological deficits. Extremities: Symmetric 5 x 5 power. Skin: No rashes,  lesions or ulcers Psychiatry: Judgement and insight appear normal. Mood & affect appropriate.   Data Reviewed:  Lab results reviewed.  Family Communication:   Disposition: Status is: Inpatient Remains inpatient appropriate because: Severity of disease, IV treatment.  Planned Discharge Destination: Home    Time spent: 35 minutes  Author: Sharen Hones, MD 05/16/2022 2:30 PM  For on call review [email or web address].

## 2022-05-17 ENCOUNTER — Encounter: Payer: Self-pay | Admitting: Internal Medicine

## 2022-05-17 DIAGNOSIS — N132 Hydronephrosis with renal and ureteral calculous obstruction: Secondary | ICD-10-CM | POA: Diagnosis not present

## 2022-05-17 DIAGNOSIS — N178 Other acute kidney failure: Secondary | ICD-10-CM | POA: Diagnosis not present

## 2022-05-17 LAB — BASIC METABOLIC PANEL
Anion gap: 4 — ABNORMAL LOW (ref 5–15)
BUN: 35 mg/dL — ABNORMAL HIGH (ref 8–23)
CO2: 22 mmol/L (ref 22–32)
Calcium: 8.6 mg/dL — ABNORMAL LOW (ref 8.9–10.3)
Chloride: 116 mmol/L — ABNORMAL HIGH (ref 98–111)
Creatinine, Ser: 1.55 mg/dL — ABNORMAL HIGH (ref 0.61–1.24)
GFR, Estimated: 45 mL/min — ABNORMAL LOW (ref >60)
Glucose, Bld: 94 mg/dL (ref 70–99)
Potassium: 3.8 mmol/L (ref 3.5–5.1)
Sodium: 142 mmol/L (ref 135–145)

## 2022-05-17 LAB — MAGNESIUM: Magnesium: 1.7 mg/dL (ref 1.7–2.4)

## 2022-05-17 MED ORDER — BISACODYL 10 MG RE SUPP
10.0000 mg | Freq: Once | RECTAL | Status: AC
Start: 2022-05-17 — End: 2022-05-17
  Administered 2022-05-17: 10 mg via RECTAL
  Filled 2022-05-17: qty 1

## 2022-05-17 MED ORDER — BUTALBITAL-APAP-CAFFEINE 50-325-40 MG PO TABS
1.0000 | ORAL_TABLET | Freq: Once | ORAL | Status: AC
  Administered 2022-05-17: 1 via ORAL
  Filled 2022-05-17: qty 1

## 2022-05-17 MED ORDER — POLYETHYLENE GLYCOL 3350 17 G PO PACK: 17.0000 g | PACK | Freq: Every day | ORAL | Status: DC | PRN

## 2022-05-17 NOTE — Progress Notes (Addendum)
Progress Note    Jonathan Blankenship  YQI:[id number] DOB: September 21, 1941  DOA: 05/15/2022 PCP: Derinda Late, MD      Brief Narrative:    Medical records reviewed and are as summarized below:   Jonathan Blankenship is a 81 y.o. male with medical history significant of coronary artery disease, congenital singular kidney, essential hypertension, dyslipidemia, gastroesophageal reflux disease who present to the hospital with a left flank pain that radiated to the groin.  CT scan showed obstructing kidney stone.  He was also found to have acute kidney injury with creatinine of 3.0.  Patient had a ureteral stent placed on 7/3.      Assessment/Plan:   Principal Problem:   Acute renal failure (ARF) (HCC) Active Problems:   Hydronephrosis with urinary obstruction due to ureteral calculus   Coronary artery disease   Essential hypertension    Body mass index is 31.42 kg/m.  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  Baseline creatinine is around 1.2.  Creatinine is down from 3 to 1.55.  Continue IV fluids at 50 cc/h.  Left ureteral calculus with hydronephrosis, right renal calculus: S/p left laser lithotripsy and ureteral stent placement.  Outpatient follow-up with urologist.  Constipation for 5 days: Start MiraLAX and Dulcolax suppository.  CAD: Continue aspirin, Plavix, simvastatin  Hypertension: Losartan has been held because of AKI.  Ambulate with assistance in anticipation of discharge tomorrow.   Diet Order             Diet Heart Room service appropriate? Yes; Fluid consistency: Thin  Diet effective now                            Consultants: Urologist  Procedures: Left ureteroscopy, laser lithotripsy and stent placement    Medications:    aspirin EC  81 mg Oral Daily   citalopram  40 mg Oral Daily   clopidogrel  75 mg Oral Daily   enoxaparin (LOVENOX) injection  40 mg Subcutaneous Q24H   isosorbide dinitrate  30 mg Oral Daily    metoprolol tartrate  25 mg Oral Daily   pantoprazole  40 mg Oral Daily   simvastatin  40 mg Oral Daily   venlafaxine XR  37.5 mg Oral Daily   Continuous Infusions:  lactated ringers 50 mL/hr at 05/17/22 1214     Anti-infectives (From admission, onward)    None              Family Communication/Anticipated D/C date and plan/Code Status   DVT prophylaxis: enoxaparin (LOVENOX) injection 40 mg Start: 05/15/22 2200 SCDs Start: 05/15/22 1037     Code Status: Full Code  Family Communication: None Disposition Plan: Plan to discharge home tomorrow   Status is: Inpatient Remains inpatient appropriate because: IV fluids for AKI       Subjective:   Interval events noted.  He complains of headache, constipation for 5 days and generalized weakness.  Objective:    Vitals:   05/16/22 0809 05/16/22 1955 05/17/22 0405 05/17/22 0812  BP: (!) 150/68 (!) 127/51 (!) 152/65 (!) 145/63  Pulse: 70 62 61 60  Resp: 20 20 20    Temp:  98.8 F (37.1 C) 98.5 F (36.9 C) 98.1 F (36.7 C)  TempSrc:  Oral Oral Oral  SpO2: 95% 97% 95% 96%  Weight:      Height:       No data found.   Intake/Output Summary (Last  24 hours) at 05/17/2022 1513 Last data filed at 05/17/2022 1406 Gross per 24 hour  Intake 3249.35 ml  Output 2200 ml  Net 1049.35 ml   Filed Weights   05/15/22 0636  Weight: 96.5 kg    Exam:  GEN: NAD SKIN: No rash EYES: EOMI ENT: MMM CV: RRR PULM: CTA B ABD: soft, ND, NT, +BS CNS: AAO x 3, non focal EXT: No edema or tenderness        Data Reviewed:   I have personally reviewed following labs and imaging studies:  Labs: Labs show the following:   Basic Metabolic Panel: Recent Labs  Lab 05/15/22 0638 05/16/22 0454 05/17/22 0341  NA 142 143 142  K 4.1 4.1 3.8  CL 112* 118* 116*  CO2 21* 21* 22  GLUCOSE 118* 111* 94  BUN 46* 39* 35*  CREATININE 3.00* 2.29* 1.55*  CALCIUM 9.1 8.2* 8.6*  MG  --  2.0 1.7   GFR Estimated Creatinine  Clearance: 43.5 mL/min (A) (by C-G formula based on SCr of 1.55 mg/dL (H)). Liver Function Tests: Recent Labs  Lab 05/15/22 0638  AST 27  ALT 23  ALKPHOS 77  BILITOT 0.6  PROT 7.4  ALBUMIN 3.6   Recent Labs  Lab 05/15/22 0638  LIPASE 58*   No results for input(s): "AMMONIA" in the last 168 hours. Coagulation profile No results for input(s): "INR", "PROTIME" in the last 168 hours.  CBC: Recent Labs  Lab 05/15/22 0638 05/16/22 0454  WBC 8.5 6.9  HGB 12.3* 10.7*  HCT 38.4* 32.9*  MCV 99.0 98.8  PLT 195 160   Cardiac Enzymes: No results for input(s): "CKTOTAL", "CKMB", "CKMBINDEX", "TROPONINI" in the last 168 hours. BNP (last 3 results) No results for input(s): "PROBNP" in the last 8760 hours. CBG: No results for input(s): "GLUCAP" in the last 168 hours. D-Dimer: No results for input(s): "DDIMER" in the last 72 hours. Hgb A1c: No results for input(s): "HGBA1C" in the last 72 hours. Lipid Profile: No results for input(s): "CHOL", "HDL", "LDLCALC", "TRIG", "CHOLHDL", "LDLDIRECT" in the last 72 hours. Thyroid function studies: No results for input(s): "TSH", "T4TOTAL", "T3FREE", "THYROIDAB" in the last 72 hours.  Invalid input(s): "FREET3" Anemia work up: No results for input(s): "VITAMINB12", "FOLATE", "FERRITIN", "TIBC", "IRON", "RETICCTPCT" in the last 72 hours. Sepsis Labs: Recent Labs  Lab 05/15/22 0638 05/16/22 0454  WBC 8.5 6.9    Microbiology No results found for this or any previous visit (from the past 240 hour(s)).  Procedures and diagnostic studies:  No results found.             LOS: 2 days   Zunairah Devers  [facility]   Pager on [email or web address]. If 7PM-7AM, please contact night-coverage at [email or web address]     05/17/2022, 3:13 PM

## 2022-05-18 DIAGNOSIS — N178 Other acute kidney failure: Secondary | ICD-10-CM | POA: Diagnosis not present

## 2022-05-18 DIAGNOSIS — N132 Hydronephrosis with renal and ureteral calculous obstruction: Secondary | ICD-10-CM | POA: Diagnosis not present

## 2022-05-18 LAB — BASIC METABOLIC PANEL
Anion gap: 5 (ref 5–15)
BUN: 23 mg/dL (ref 8–23)
CO2: 24 mmol/L (ref 22–32)
Calcium: 8.5 mg/dL — ABNORMAL LOW (ref 8.9–10.3)
Chloride: 109 mmol/L (ref 98–111)
Creatinine, Ser: 1.16 mg/dL (ref 0.61–1.24)
GFR, Estimated: >60 mL/min (ref >60)
Glucose, Bld: 98 mg/dL (ref 70–99)
Potassium: 3.6 mmol/L (ref 3.5–5.1)
Sodium: 138 mmol/L (ref 135–145)

## 2022-05-18 NOTE — Care Management Important Message (Signed)
Important Message  Patient Details  Name: Jonathan Blankenship MRN: [id number] Date of Birth: 1941/10/02   Medicare Important Message Given:  N/A - LOS <3 / Initial given by admissions     [name] 05/18/2022, 8:57 AM

## 2022-05-18 NOTE — Progress Notes (Incomplete)
{  Select_TRH_Note:[id number]} 

## 2022-05-18 NOTE — Progress Notes (Signed)
MD order received in [facility] to discharge pt home today; pt discharged via wheelchair by nursing to the ED parking lot where the pt had driven himself to the ED in his personal automobile

## 2022-05-18 NOTE — Discharge Summary (Signed)
Physician Discharge Summary   Patient: Jonathan Blankenship MRN: [id number] DOB: 1941/09/10  Admit date:     05/15/2022  Discharge date: 05/18/2022  Discharge Physician: Jennye Boroughs   PCP: Derinda Late, MD   Recommendations at discharge:   Follow up with PCP in 1 week  Discharge Diagnoses: Principal Problem:   Acute renal failure (ARF) ([place]) Active Problems:   Hydronephrosis with urinary obstruction due to ureteral calculus   Coronary artery disease   Essential hypertension  Resolved Problems:   * No resolved hospital problems. *  Hospital Course:  Jonathan Blankenship is a 81 y.o. male with medical history significant of coronary artery disease, congenital singular kidney, essential hypertension, dyslipidemia, gastroesophageal reflux disease who presented to the hospital with a left flank pain that radiated to the groin.    CT scan showed left ureteral calculus with hydronephrosis and right renal calculus.Marland Kitchen  He was also found to have acute kidney injury with creatinine of 3.0.  He was treated with IV fluids.  He was seen in consultation by the urologist and he underwent left laser lithotripsy and left ureteral stent placement.  Abdominal pain and AKI have resolved.  He feels better and he is deemed stable for discharge to home.        Consultants: Urologist Procedures performed: Left ureteral stent placement and left laser lithotripsy Disposition: Home Diet recommendation:  Discharge Diet Orders (From admission, onward)     Start     Ordered   05/18/22 0000  Diet - low sodium heart healthy        05/18/22 0933           Cardiac diet DISCHARGE MEDICATION: Allergies as of 05/18/2022   No Known Allergies      Medication List     TAKE these medications    albuterol 108 (90 Base) MCG/ACT inhaler Commonly known as: VENTOLIN HFA Inhale 2 puffs into the lungs every 4 (four) hours as needed for wheezing or shortness of breath.   aspirin EC 81 MG tablet Take 81 mg by  mouth daily.   citalopram 40 MG tablet Commonly known as: CELEXA Take 40 mg by mouth daily.   clopidogrel 75 MG tablet Commonly known as: PLAVIX Take 75 mg by mouth daily.   isosorbide dinitrate 30 MG tablet Commonly known as: ISORDIL Take 30 mg by mouth daily.   losartan 100 MG tablet Commonly known as: COZAAR Take 100 mg by mouth daily.   metoprolol tartrate 25 MG tablet Commonly known as: LOPRESSOR Take 25 mg by mouth daily.   omeprazole 20 MG capsule Commonly known as: PRILOSEC Take 20 mg by mouth daily.   simvastatin 40 MG tablet Commonly known as: ZOCOR Take 40 mg by mouth daily.   venlafaxine XR 37.5 MG 24 hr capsule Commonly known as: EFFEXOR-XR Take 37.5 mg by mouth daily.        Follow-up Information     Billey Co, MD. Schedule an appointment as soon as possible for a visit in 1 week(s).   Specialty: Urology Why: for left ureteral stent removal Contact information: [place] [address] [phone]                Discharge Exam: Danley Danker Weights   05/15/22 0636  Weight: 96.5 kg   GEN: NAD SKIN: Warm and dry EYES: No pallor or icterus ENT: MMM CV: RRR PULM: CTA B ABD: soft, ND, NT, +BS CNS: AAO x 3, non focal EXT: No  edema or tenderness GU: No CVA tenderness  Condition at discharge: good  The results of significant diagnostics from this hospitalization (including imaging, microbiology, ancillary and laboratory) are listed below for reference.   Imaging Studies: DG OR UROLOGY CYSTO IMAGE ([facility] ONLY)  Result Date: 05/15/2022 There is no interpretation for this exam.  This order is for images obtained during a surgical procedure.  Please See "Surgeries" Tab for more information regarding the procedure.   CT Renal Stone Study  Result Date: 05/15/2022 CLINICAL DATA:  Flank pain.  Evaluate for kidney stone. EXAM: CT ABDOMEN AND PELVIS WITHOUT CONTRAST TECHNIQUE: Multidetector CT imaging of the abdomen and  pelvis was performed following the standard protocol without IV contrast. RADIATION DOSE REDUCTION: This exam was performed according to the departmental dose-optimization program which includes automated exposure control, adjustment of the mA and/or kV according to patient size and/or use of iterative reconstruction technique. COMPARISON:  CT chest 11/26/2021. FINDINGS: Lower chest: Mild subpleural reticulation noted within the lung bases. No acute abnormality Hepatobiliary: No focal liver abnormality is seen. No gallstones, gallbladder wall thickening, or biliary dilatation. Pancreas: Unremarkable. No pancreatic ductal dilatation or surrounding inflammatory changes. Spleen: Normal in size without focal abnormality. Adrenals/Urinary Tract: Normal adrenal glands. Right renal atrophy. Cortical calcifications identified within the inferior pole of the right kidney. Within the interpolar collecting system of the right kidney there is a nonobstructing stone measuring 3 mm, image 47/2. There is asymmetric left hydronephrosis and hydroureter. Stone within the left mid ureter at the level of the aortic bifurcation measures 6 mm, image 63/2. The urinary bladder appears normal. Stomach/Bowel: Small hiatal hernia. The appendix is not confidently identified. No secondary signs of acute appendicitis. No bowel wall thickening, inflammation, or distension. Vascular/Lymphatic: Aortic atherosclerosis. No signs of abdominopelvic adenopathy. Reproductive: Prostate is unremarkable. Other: No free fluid or fluid collections. No signs of pneumoperitoneum. Musculoskeletal: No acute or significant osseous findings. 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 IMPRESSION: 1. Left-sided hydronephrosis and hydroureter secondary to 6 mm left mid ureteral calculus. 2. Nonobstructing right renal calculus. 3. Small hiatal hernia. 4. Aortic Atherosclerosis (ICD10-I70.0). Electronically Signed   By: Kerby Moors M.D.   On: 05/15/2022 08:07     Microbiology: Results for orders placed or performed during the hospital encounter of 11/26/21  Resp Panel by RT-PCR (Flu A&B, Covid) Nasopharyngeal Swab     Status: None   Collection Time: 11/26/21  1:42 AM   Specimen: Nasopharyngeal Swab; Nasopharyngeal(NP) swabs in vial transport medium  Result Value Ref Range Status   SARS Coronavirus 2 by RT PCR NEGATIVE NEGATIVE Final    Comment: (NOTE) SARS-CoV-2 target nucleic acids are NOT DETECTED.  The SARS-CoV-2 RNA is generally detectable in upper respiratory specimens during the acute phase of infection. The lowest concentration of SARS-CoV-2 viral copies this assay can detect is 138 copies/mL. A negative result does not preclude SARS-Cov-2 infection and should not be used as the sole basis for treatment or other patient management decisions. A negative result may occur with  improper specimen collection/handling, submission of specimen other than nasopharyngeal swab, presence of viral mutation(s) within the areas targeted by this assay, and inadequate number of viral copies(<138 copies/mL). A negative result must be combined with clinical observations, patient history, and epidemiological information. The expected result is Negative.  Fact Sheet for Patients:  EntrepreneurPulse.com.au  Fact Sheet for Healthcare Providers:  IncredibleEmployment.be  This test is no t yet approved or cleared by the [place] and  has been authorized  for detection and/or diagnosis of SARS-CoV-2 by FDA under an Emergency Use Authorization (EUA). This EUA will remain  in effect (meaning this test can be used) for the duration of the COVID-19 declaration under Section 564(b)(1) of the Act, 21 U.S.C.section 360bbb-3(b)(1), unless the authorization is terminated  or revoked sooner.       Influenza A by PCR NEGATIVE NEGATIVE Final   Influenza B by PCR NEGATIVE NEGATIVE Final    Comment: (NOTE) The Xpert  Xpress SARS-CoV-2/FLU/RSV plus assay is intended as an aid in the diagnosis of influenza from Nasopharyngeal swab specimens and should not be used as a sole basis for treatment. Nasal washings and aspirates are unacceptable for Xpert Xpress SARS-CoV-2/FLU/RSV testing.  Fact Sheet for Patients: EntrepreneurPulse.com.au  Fact Sheet for Healthcare Providers: IncredibleEmployment.be  This test is not yet approved or cleared by the [place] FDA and has been authorized for detection and/or diagnosis of SARS-CoV-2 by FDA under an Emergency Use Authorization (EUA). This EUA will remain in effect (meaning this test can be used) for the duration of the COVID-19 declaration under Section 564(b)(1) of the Act, 21 U.S.C. section 360bbb-3(b)(1), unless the authorization is terminated or revoked.  Performed at [facility], [address] [place], [place] [address]     Labs: CBC: Recent Labs  Lab 05/15/22 [phone] 05/16/22 0454  WBC 8.5 6.9  HGB 12.3* 10.7*  HCT 38.4* 32.9*  MCV 99.0 98.8  PLT 195 621   Basic Metabolic Panel: Recent Labs  Lab 05/15/22 0638 05/16/22 0454 05/17/22 0341 05/18/22 0503  NA 142 143 142 138  K 4.1 4.1 3.8 3.6  CL 112* 118* 116* 109  CO2 21* 21* 22 24  GLUCOSE 118* 111* 94 98  BUN 46* 39* 35* 23  CREATININE 3.00* 2.29* 1.55* 1.16  CALCIUM 9.1 8.2* 8.6* 8.5*  MG  --  2.0 1.7  --    Liver Function Tests: Recent Labs  Lab 05/15/22 0638  AST 27  ALT 23  ALKPHOS 77  BILITOT 0.6  PROT 7.4  ALBUMIN 3.6   CBG: No results for input(s): "GLUCAP" in the last 168 hours.  Discharge time spent: greater than 30 minutes.  Signed: Jennye Boroughs, MD [facility] 05/18/2022

## 2022-05-18 NOTE — Plan of Care (Signed)

## 2022-05-24 ENCOUNTER — Encounter: Payer: Self-pay | Admitting: Urology

## 2022-05-28 ENCOUNTER — Emergency Department
Admission: EM | Admit: 2022-05-28 | Discharge: 2022-05-28 | Disposition: A | Discharge status: Home / Self Care | Payer: Medicare HMO | Attending: Emergency Medicine | Admitting: Emergency Medicine

## 2022-05-28 ENCOUNTER — Other Ambulatory Visit: Payer: Self-pay

## 2022-05-28 ENCOUNTER — Emergency Department: Payer: Medicare HMO

## 2022-05-28 DIAGNOSIS — N200 Calculus of kidney: Secondary | ICD-10-CM | POA: Diagnosis not present

## 2022-05-28 DIAGNOSIS — R109 Unspecified abdominal pain: Secondary | ICD-10-CM

## 2022-05-28 LAB — URINALYSIS, ROUTINE W REFLEX MICROSCOPIC
Bilirubin Urine: NEGATIVE
Glucose, UA: NEGATIVE mg/dL
Ketones, ur: NEGATIVE mg/dL
Nitrite: NEGATIVE
Protein, ur: 100 mg/dL — AB
RBC / HPF: >50 RBC/hpf — ABNORMAL HIGH (ref 0–5)
Specific Gravity, Urine: 1.015 (ref 1.005–1.030)
Squamous Epithelial / HPF: NONE SEEN (ref 0–5)
WBC, UA: >50 WBC/hpf — ABNORMAL HIGH (ref 0–5)
pH: 5 (ref 5.0–8.0)

## 2022-05-28 LAB — COMPREHENSIVE METABOLIC PANEL
ALT: 16 U/L (ref 0–44)
AST: 23 U/L (ref 15–41)
Albumin: 3.7 g/dL (ref 3.5–5.0)
Alkaline Phosphatase: 81 U/L (ref 38–126)
Anion gap: 8 (ref 5–15)
BUN: 26 mg/dL — ABNORMAL HIGH (ref 8–23)
CO2: 23 mmol/L (ref 22–32)
Calcium: 8.9 mg/dL (ref 8.9–10.3)
Chloride: 107 mmol/L (ref 98–111)
Creatinine, Ser: 1.5 mg/dL — ABNORMAL HIGH (ref 0.61–1.24)
GFR, Estimated: 47 mL/min — ABNORMAL LOW (ref >60)
Glucose, Bld: 123 mg/dL — ABNORMAL HIGH (ref 70–99)
Potassium: 4.1 mmol/L (ref 3.5–5.1)
Sodium: 138 mmol/L (ref 135–145)
Total Bilirubin: 0.6 mg/dL (ref 0.3–1.2)
Total Protein: 7.6 g/dL (ref 6.5–8.1)

## 2022-05-28 LAB — CBC
HCT: 39.3 % (ref 39.0–52.0)
Hemoglobin: 12.3 g/dL — ABNORMAL LOW (ref 13.0–17.0)
MCH: 31.4 pg (ref 26.0–34.0)
MCHC: 31.3 g/dL (ref 30.0–36.0)
MCV: 100.3 fL — ABNORMAL HIGH (ref 80.0–100.0)
Platelets: 243 10*3/uL (ref 150–400)
RBC: 3.92 MIL/uL — ABNORMAL LOW (ref 4.22–5.81)
RDW: 11.9 % (ref 11.5–15.5)
WBC: 9.2 10*3/uL (ref 4.0–10.5)
nRBC: 0 % (ref 0.0–0.2)

## 2022-05-28 LAB — LIPASE, BLOOD: Lipase: 49 U/L (ref 11–51)

## 2022-05-28 MED ORDER — OXYCODONE-ACETAMINOPHEN 5-325 MG PO TABS: 1.0000 | ORAL_TABLET | Freq: Four times a day (QID) | ORAL | 0 refills | Status: DC | PRN

## 2022-05-28 MED ORDER — HYDROCODONE-ACETAMINOPHEN 5-325 MG PO TABS: 2.0000 | ORAL_TABLET | Freq: Once | ORAL | Status: DC

## 2022-05-28 MED ORDER — CEPHALEXIN 500 MG PO CAPS: 500.0000 mg | ORAL_CAPSULE | Freq: Three times a day (TID) | ORAL | 0 refills | Status: DC

## 2022-05-28 MED ORDER — ONDANSETRON 4 MG PO TBDP: 4.0000 mg | ORAL_TABLET | Freq: Three times a day (TID) | ORAL | 0 refills | Status: DC | PRN

## 2022-05-28 MED ORDER — HYDROCODONE-ACETAMINOPHEN 5-325 MG PO TABS
1.0000 | ORAL_TABLET | Freq: Once | ORAL | Status: AC
  Administered 2022-05-28: 1 via ORAL
  Filled 2022-05-28: qty 1

## 2022-05-28 MED ORDER — ONDANSETRON 4 MG PO TBDP
8.0000 mg | ORAL_TABLET | Freq: Once | ORAL | Status: AC
  Administered 2022-05-28: 8 mg via ORAL
  Filled 2022-05-28: qty 2

## 2022-05-28 NOTE — ED Provider Triage Note (Signed)
Emergency Medicine Provider Triage Evaluation Note  Jonathan Blankenship , a 81 y.o. male  was evaluated in triage.  Pt complains of right-sided flank pain.  History of kidney stones.  Recently had to have stent and lithotripsy..  Review of Systems  Positive: Right-sided flank pain Negative: Fever chills  Physical Exam  BP (!) 147/82   Pulse 76   Temp 98 F (36.7 C)   Resp 18   SpO2 100%  Gen:   Awake, no distress   Resp:  Normal effort  MSK:   Moves extremities without difficulty  Other:    Medical Decision Making  Medically screening exam initiated at 9:09 AM.  Appropriate orders placed.  [name] was informed that the remainder of the evaluation will be completed by another provider, this initial triage assessment does not replace that evaluation, and the importance of remaining in the ED until their evaluation is complete.  Basic labs and CT renal stone study ordered   Versie Starks, PA-C 05/28/22 5945

## 2022-05-28 NOTE — ED Triage Notes (Signed)
Pt comes with c/o right sided pain that started about 3 hours ago. Pt recently admitted for same on left side and had surgery.

## 2022-05-28 NOTE — ED Notes (Signed)
Pt is actively vomiting post PO pain medication. Will make MD aware.

## 2022-05-28 NOTE — ED Provider Notes (Signed)
[facility] Provider Note   Event Date/Time   First MD Initiated Contact with Patient 05/28/22 1223     (approximate) History  Abdominal Pain  HPI Jonathan Blankenship is a 81 y.o. male with a stated recent past medical history of left ureteral calculi who presents for right-sided flank and abdominal pain that began approximately 3 hours prior to arrival and has been stable since onset.  Patient currently complains of 8/10, nonradiating, sharp right flank pain that has no exacerbating or relieving factors.  Patient states that he has been taking Flomax that was prescribed recently. ROS: Patient currently denies any vision changes, tinnitus, difficulty speaking, facial droop, sore throat, chest pain, shortness of breath, abdominal pain, nausea/vomiting/diarrhea, dysuria, or weakness/numbness/paresthesias in any extremity   Physical Exam  Triage Vital Signs: ED Triage Vitals [05/28/22 0909]  Enc Vitals Group     BP (!) 147/82     Pulse Rate 76     Resp 18     Temp 98 F (36.7 C)     Temp src      SpO2 100 %     Weight      Height      Head Circumference      Peak Flow      Pain Score 6     Pain Loc      Pain Edu?      Excl. in [place]?    Most recent vital signs: Vitals:   05/28/22 0909 05/28/22 1230  BP: (!) 147/82 (!) 168/68  Pulse: 76 70  Resp: 18 16  Temp: 98 F (36.7 C)   SpO2: 100% 100%   General: Awake, oriented x4. CV:  Good peripheral perfusion.  Resp:  Normal effort.  Abd:  No distention.  Other:  Overweight elderly Caucasian male laying in bed in no acute distress ED Results / Procedures / Treatments  Labs (all labs ordered are listed, but only abnormal results are displayed) Labs Reviewed  COMPREHENSIVE METABOLIC PANEL - Abnormal; Notable for the following components:      Result Value   Glucose, Bld 123 (*)    BUN 26 (*)    Creatinine, Ser 1.50 (*)    GFR, Estimated 47 (*)    All other components within normal limits  CBC - Abnormal;  Notable for the following components:   RBC 3.92 (*)    Hemoglobin 12.3 (*)    MCV 100.3 (*)    All other components within normal limits  LIPASE, BLOOD  URINALYSIS, ROUTINE W REFLEX MICROSCOPIC   RADIOLOGY ED MD interpretation: CT of the abdomen and pelvis without IV contrast interpreted by me and shows a 3 mm right UVJ stone with mild right hydronephrosis -Agree with radiology assessment Official radiology report(s): CT Renal Stone Study  Result Date: 05/28/2022 CLINICAL DATA:  Acute onset right flank and abdominal pain proximally 3 hours ago. Nephrolithiasis. EXAM: CT ABDOMEN AND PELVIS WITHOUT CONTRAST TECHNIQUE: Multidetector CT imaging of the abdomen and pelvis was performed following the standard protocol without IV contrast. RADIATION DOSE REDUCTION: This exam was performed according to the departmental dose-optimization program which includes automated exposure control, adjustment of the mA and/or kV according to patient size and/or use of iterative reconstruction technique. COMPARISON:  05/15/2022 FINDINGS: Lower chest: No acute findings. Hepatobiliary: No mass visualized on this unenhanced exam. Gallbladder is unremarkable. No evidence of biliary ductal dilatation. Pancreas: No mass or inflammatory process visualized on this unenhanced exam. Spleen:  Within normal limits  in size. Adrenals/Urinary tract: New left ureteral stent is seen in appropriate position and previously seen left ureteral calculus is no longer visualized. Mild left hydronephrosis remains stable. Moderate right renal parenchymal atrophy is unchanged. Several small less than 1 cm renal calculi are again noted bilaterally. New mild right hydronephrosis is seen due to a 3 mm calculus at the right UVJ. Stomach/Bowel: Stable small hiatal hernia. No evidence of obstruction, inflammatory process, or abnormal fluid collections. Normal appendix visualized. Vascular/Lymphatic: No pathologically enlarged lymph nodes identified. No  evidence of abdominal aortic aneurysm. Aortic atherosclerotic calcification incidentally noted. Reproductive:  No mass or other significant abnormality. Other:  None. Musculoskeletal:  No suspicious bone lesions identified. IMPRESSION: New mild right hydronephrosis due to 3 mm calculus at the right UVJ. Stable chronic right renal parenchymal atrophy. New left ureteral stent in appropriate position. Mild left hydronephrosis, without significant change. Bilateral nephrolithiasis. Stable small hiatal hernia. Aortic Atherosclerosis (ICD10-I70.0). Electronically Signed   By: Marlaine Hind M.D.   On: 05/28/2022 10:09   PROCEDURES: Critical Care performed: No Procedures MEDICATIONS ORDERED IN ED: Medications  HYDROcodone-acetaminophen (NORCO/VICODIN) 5-325 MG per tablet 1 tablet (1 tablet Oral Given 05/28/22 1319)   IMPRESSION / MDM / ASSESSMENT AND PLAN / ED COURSE  I reviewed the triage vital signs and the nursing notes.                             The patient is on the cardiac monitor to evaluate for evidence of arrhythmia and/or significant heart rate changes. Patient's presentation is most consistent with acute presentation with potential threat to life or bodily function. Patient presents for severe flank pain. Presentation most consistent with Renal Colic from a Non-infected Kidney Stone. Given History and Exam I have lower suspicion for atypical appendicitis, genital torsion, acute cholecystitis, AAA, Aortic Dissection, Serious Bacterial Illness or other emergent intraabdominal pathology.  Workup: CBC, BMP, CT Abd/Pelvis noncontrast, UA, reassess Findings: Right UVJ stone with mild hydronephrosis Reassesment: Patient tolerating PO and pain controlled Disposition:  Discharge. Strict return precautions for infected stone or PO intolerance discussed.   FINAL CLINICAL IMPRESSION(S) / ED DIAGNOSES   Final diagnoses:  Kidney stone on right side  Acute right flank pain   Rx / DC Orders   ED  Discharge Orders     None      Note:  This document was prepared using Dragon voice recognition software and may include unintentional dictation errors.   Naaman Plummer, MD 05/28/22 [phone]

## 2022-05-28 NOTE — ED Notes (Signed)
Pt ambulated to room 17 from waiting room with no assistance. Pt seated in bed and in no discomfort.

## 2022-05-28 NOTE — ED Notes (Signed)
Pt given ginger ale and crackers per EDP, pt tolerating well.

## 2022-05-29 LAB — URINE CULTURE: Culture: 30000 — AB

## 2022-05-31 ENCOUNTER — Encounter: Payer: Self-pay | Admitting: Urology

## 2022-05-31 ENCOUNTER — Ambulatory Visit (INDEPENDENT_AMBULATORY_CARE_PROVIDER_SITE_OTHER): Payer: Medicare HMO | Admitting: Urology

## 2022-05-31 VITALS — BP 103/65 | HR 80 | Ht 68.0 in | Wt 221.3 lb

## 2022-05-31 DIAGNOSIS — N200 Calculus of kidney: Secondary | ICD-10-CM | POA: Diagnosis not present

## 2022-05-31 DIAGNOSIS — Z466 Encounter for fitting and adjustment of urinary device: Secondary | ICD-10-CM

## 2022-05-31 LAB — MICROSCOPIC EXAMINATION
RBC, Urine: >30 /hpf — AB (ref 0–2)
WBC, UA: >30 /hpf — AB (ref 0–5)

## 2022-05-31 LAB — URINALYSIS, COMPLETE
Bilirubin, UA: NEGATIVE
Glucose, UA: NEGATIVE
Nitrite, UA: NEGATIVE
Specific Gravity, UA: 1.025 (ref 1.005–1.030)
Urobilinogen, Ur: 1 mg/dL (ref 0.2–1.0)
pH, UA: 6 (ref 5.0–7.5)

## 2022-05-31 MED ORDER — TAMSULOSIN HCL 0.4 MG PO CAPS: 0.4000 mg | ORAL_CAPSULE | Freq: Every day | ORAL | 0 refills | Status: AC

## 2022-05-31 MED ORDER — OXYCODONE-ACETAMINOPHEN 5-325 MG PO TABS: 1.0000 | ORAL_TABLET | Freq: Four times a day (QID) | ORAL | 0 refills | Status: AC | PRN

## 2022-05-31 NOTE — Progress Notes (Signed)
   05/31/2022 3:51 PM   Jonathan Blankenship December 10, 1940 [id number]  Reason for visit: Follow up nephrolithiasis  HPI: 81 year old male who presented on 05/15/2022 with left-sided flank pain secondary to a 6 mm left mid ureteral stone with hydronephrosis and AKI with creatinine of 3.0 from a baseline of 1.2.  CT showed an atrophic right kidney with multiple nonobstructing right renal stones at that time.  He underwent left ureteroscopy, laser lithotripsy, stent placement at that time, and was scheduled for follow-up today for stent removal.  He actually developed right-sided abdominal pain on 05/28/2022, and CT in the ER showed a 4 mm right proximal ureteral stone with mild hydronephrosis.  Stent on the left side was in appropriate position.  Urine culture showed a small amount of yeast, but otherwise no growth.  He was discharged with urology follow-up.  Renal function was only mildly abnormal at that time with a creatinine of 1.5 from baseline of 1.2.  He continues to have some mild intermittent right-sided flank pain that seems to be a little bit lower.  He denies any fevers, chills, or urinary symptoms.  We discussed options at length with his complex presentation.  We discussed proceeding to the OR this week for right ureteroscopy, laser lithotripsy, stent placement, and left ureteral stent removal, or trial of medical expulsive therapy for his 4 mm right proximal ureteral stone with a ~75% spontaneous passage rate.  He opts for trial of medical expulsive therapy.  Risk benefits were discussed extensively.   -Trial of medical expulsive therapy for 4 mm right proximal ureteral stone, Flomax and Percocet sent in -RTC next week for UA and KUB, if persistent right ureteral stone anticipate proceeding to the OR end of next week for right ureteroscopy, laser lithotripsy, stent placement and left ureteral stent removal.  If stone passes likely will remove left ureteral stent at follow-up -Return precautions  discussed extensively   Billey Co, MD  [facility] [address] [place], [place] [address] [phone]

## 2022-06-07 ENCOUNTER — Ambulatory Visit
Admission: RE | Admit: 2022-06-07 | Discharge: 2022-06-07 | Disposition: A | Discharge status: Home / Self Care | Payer: Medicare HMO | Attending: Urology | Admitting: Urology

## 2022-06-07 ENCOUNTER — Ambulatory Visit
Admission: RE | Admit: 2022-06-07 | Discharge: 2022-06-07 | Disposition: A | Discharge status: Home / Self Care | Payer: Medicare HMO | Source: Ambulatory Visit | Attending: Urology | Admitting: Urology

## 2022-06-07 DIAGNOSIS — N200 Calculus of kidney: Secondary | ICD-10-CM

## 2022-06-08 ENCOUNTER — Telehealth: Payer: Self-pay

## 2022-06-08 ENCOUNTER — Encounter: Payer: Self-pay | Admitting: Urology

## 2022-06-08 ENCOUNTER — Ambulatory Visit (INDEPENDENT_AMBULATORY_CARE_PROVIDER_SITE_OTHER): Payer: Medicare HMO | Admitting: Urology

## 2022-06-08 ENCOUNTER — Other Ambulatory Visit: Payer: Self-pay | Admitting: Urology

## 2022-06-08 VITALS — BP 126/84 | HR 80 | Ht 69.0 in | Wt 215.0 lb

## 2022-06-08 DIAGNOSIS — N200 Calculus of kidney: Secondary | ICD-10-CM

## 2022-06-08 DIAGNOSIS — N201 Calculus of ureter: Secondary | ICD-10-CM

## 2022-06-08 DIAGNOSIS — Z466 Encounter for fitting and adjustment of urinary device: Secondary | ICD-10-CM

## 2022-06-08 MED ORDER — SULFAMETHOXAZOLE-TRIMETHOPRIM 800-160 MG PO TABS
1.0000 | ORAL_TABLET | Freq: Once | ORAL | Status: AC
  Administered 2022-06-08: 1 via ORAL

## 2022-06-08 MED ORDER — SULFAMETHOXAZOLE-TRIMETHOPRIM 800-160 MG PO TABS: 1.0000 | ORAL_TABLET | Freq: Two times a day (BID) | ORAL | 0 refills | Status: DC

## 2022-06-08 MED ORDER — FLUCONAZOLE 150 MG PO TABS: 150.0000 mg | ORAL_TABLET | Freq: Every day | ORAL | 0 refills | Status: DC

## 2022-06-08 NOTE — Progress Notes (Signed)
Surgical Physician Order Form [facility]  * Scheduling expectation :  Friday 7/20  *Length of Case: 45 minutes  *Clearance needed: no  *Anticoagulation Instructions: May continue all anticoagulants  *Aspirin Instructions: Ok to continue Aspirin  *Post-op visit Date/Instructions:   TBD  *Diagnosis: Right Ureteral Stone  *Procedure: right Ureteroscopy w/laser lithotripsy & stent placement (88416), left ureteral stent removal   Additional orders: N/A  -Admit type: OUTpatient  -Anesthesia: General  -VTE Prophylaxis Standing Order SCD's       Other:   -Standing Lab Orders Per Anesthesia    Lab other:  UA today  -Standing Test orders EKG/Chest x-ray per Anesthesia       Test other:   - Medications:  Diflucan 100mg  IV, Ancef 2 g  -Other orders:  N/A

## 2022-06-08 NOTE — Progress Notes (Signed)
   06/08/2022 8:43 AM   Jonathan Blankenship [birth year] [id number]  Reason for visit: Follow up right ureteral stone  HPI: 81 year old male who presented on 05/15/2022 with left-sided flank pain secondary to a 6 mm left mid ureteral stone with hydronephrosis and AKI with creatinine of 3.0 from a baseline of 1.2.  CT showed an atrophic right kidney with multiple nonobstructing right renal stones at that time.  He underwent left ureteroscopy, laser lithotripsy, stent placement at that time, and was scheduled for follow-up stent removal.  He actually presented to the ER on 05/28/2022 with right-sided flank pain, and CT at that time showed a 4 mm right proximal ureteral stone and the atrophic right kidney, with multiple right nonobstructing renal stones.  Using shared decision making on 05/31/2022, he opted for trial of medical expulsive therapy.  He has continued to have right-sided intermittent flank pain in the low back and right groin, as well as some nausea and vomiting over the last few days.  KUB today shows persistent stones over the right kidney, right proximal ureteral stone not definitively visualized, but this may be uric acid based on CT HU density of 400.  There are also numerous phleboliths in the right pelvis that make interpretation challenging.  With his persistent symptoms, he opted to pursue right ureteroscopy, laser lithotripsy, stent placement, with simultaneous left stent removal. We specifically discussed the risks ureteroscopy including bleeding, infection/sepsis, stent related symptoms including flank pain/urgency/frequency/incontinence/dysuria, ureteral injury, inability to access stone, or need for staged or additional procedures.  Urinalysis today with greater than 30 WBCs, greater than 30 RBCs, moderate bacteria, leukocytes.  Will start on Bactrim DS twice daily as well as fluconazole.  Urine culture from 05/28/2022 showed 30k yeast.  Urine sent for culture.  He does not have any symptoms  of sepsis from urinary source, fever, chills.  Right ureteroscopy, laser lithotripsy, stent placement, left ureteral stent removal tomorrow Recent urine culture with small amount of yeast, but urinalysis today suspicious even with indwelling stent, started on Bactrim DS twice daily and fluconazole, urine sent for culture    Billey Co, MD  [facility] [address], [place],  [address] [phone]

## 2022-06-08 NOTE — Progress Notes (Signed)
[facility] Posting Form   Surgery Date/Time: Date: 06/09/2022  Surgeon: Dr. Nickolas Madrid, MD  Surgery Location: Day Surgery  Inpt ( No  )   Outpt (Yes)   Obs ( No  )   Diagnosis: N20.1 Right Ureteral Stone Z46.6 Removal of Left  Ureteral Stent  -CPT: 28208, 52310  Surgery: Right Ureteroscopy with laser lithotripsy and stent placement, Left Cystoscopy with stent removal  Stop Anticoagulations: No, may continue ASA  Cardiac/Medical/Pulmonary Clearance needed: no  *Orders entered into EPIC  Date: 06/08/22   *Case booked in EPIC  Date: 06/08/22  *Notified pt of Surgery: Date: 06/08/22  PRE-OP UA & CX: yes, obtained in clinic on 06/08/2022  *Placed into Prior Authorization Work Fabio Bering Date: 06/08/22   Assistant/laser/rep:No

## 2022-06-08 NOTE — Telephone Encounter (Signed)
I spoke with Jonathan Blankenship. We have discussed possible surgery dates and Friday July 28th, 2023 was agreed upon by all parties. Patient given information about surgery date, what to expect pre-operatively and post operatively.  We discussed that a Pre-Admission Testing office will be calling to set up the pre-op visit that will take place prior to surgery, and that these appointments are typically done over the phone with a Pre-Admissions RN.  Informed patient that our office will communicate any additional care to be provided after surgery. Patients questions or concerns were discussed during our call. Advised to call our office should there be any additional information, questions or concerns that arise. Patient verbalized understanding.

## 2022-06-08 NOTE — Patient Instructions (Signed)
Laser Therapy for Kidney Stones Laser therapy for kidney stones is a procedure to break up small, hard mineral deposits that form in the kidney (kidney stones). The procedure is done using a device that produces a focused beam of light (laser). The laser breaks up kidney stones into pieces that are small enough to be passed out of the body through urination or removed from the body during the procedure. You may need laser therapy if you have kidney stones that are painful or block your urinary tract. This procedure is done by inserting a tube (ureteroscope) into your kidney through the urethral opening. The urethra is the part of the body that drains urine from the bladder. In women, the urethra opens above the vaginal opening. In men, the urethra opens at the tip of the penis. The ureteroscope is inserted through the urethra, and surgical instruments are moved through the bladder and the muscular tube that connects the kidney to the bladder (ureter) until they reach the kidney. Tell a health care provider about: Any allergies you have. All medicines you are taking, including vitamins, herbs, eye drops, creams, and over-the-counter medicines. Any problems you or family members have had with anesthetic medicines. Any blood disorders you have. Any surgeries you have had. Any medical conditions you have. Whether you are pregnant or may be pregnant. What are the risks? Generally, this is a safe procedure. However, problems may occur, including: Infection. Bleeding. Allergic reactions to medicines. Damage to the urethra, bladder, or ureter. Urinary tract infection (UTI). Narrowing of the urethra (urethral stricture). Difficulty passing urine. Blockage of the kidney caused by a fragment of kidney stone. What happens before the procedure? Medicines Ask your health care provider about: Changing or stopping your regular medicines. This is especially important if you are taking diabetes medicines or  blood thinners. Taking medicines such as aspirin and ibuprofen. These medicines can thin your blood. Do not take these medicines unless your health care provider tells you to take them. Taking over-the-counter medicines, vitamins, herbs, and supplements. Eating and drinking Follow instructions from your health care provider about eating and drinking, which may include: 8 hours before the procedure - stop eating heavy meals or foods, such as meat, fried foods, or fatty foods. 6 hours before the procedure - stop eating light meals or foods, such as toast or cereal. 6 hours before the procedure - stop drinking milk or drinks that contain milk. 2 hours before the procedure - stop drinking clear liquids. Staying hydrated Follow instructions from your health care provider about hydration, which may include: Up to 2 hours before the procedure - you may continue to drink clear liquids, such as water, clear fruit juice, black coffee, and plain tea.  General instructions You may have a physical exam before the procedure. You may also have tests, such as imaging tests and blood or urine tests. If your ureter is too narrow, your health care provider may place a soft, flexible tube (stent) inside of it. The stent may be placed days or weeks before your laser therapy procedure. Plan to have someone take you home from the hospital or clinic. If you will be going home right after the procedure, plan to have someone stay with you for 24 hours. Do not use any products that contain nicotine or tobacco for at least 4 weeks before the procedure. These products include cigarettes, e-cigarettes, and chewing tobacco. If you need help quitting, ask your health care provider. Ask your health care provider: How your   surgical site will be marked or identified. What steps will be taken to help prevent infection. These may include: Removing hair at the surgery site. Washing skin with a germ-killing soap. Taking antibiotic  medicine. What happens during the procedure?  An IV will be inserted into one of your veins. You will be given one or more of the following: A medicine to help you relax (sedative). A medicine to numb the area (local anesthetic). A medicine to make you fall asleep (general anesthetic). A ureteroscope will be inserted into your urethra. The ureteroscope will send images to a video screen in the operating room to guide your surgeon to the area of your kidney that will be treated. A small, flexible tube will be threaded through the ureteroscope and into your bladder and ureter, up to your kidney. The laser device will be inserted into your kidney through the tube. Your surgeon will pulse the laser on and off to break up kidney stones. A surgical instrument that has a tiny wire basket may be inserted through the tube into your kidney to remove the pieces of broken kidney stone. The procedure may vary among health care providers and hospitals. What happens after the procedure? Your blood pressure, heart rate, breathing rate, and blood oxygen level will be monitored until you leave the hospital or clinic. You will be given pain medicine as needed. You may continue to receive antibiotics. You may have a stent temporarily placed in your ureter. Do not drive for 24 hours if you were given a sedative during your procedure. You may be given a strainer to collect any stone fragments that you pass in your urine. Your health care provider may have these tested. Summary Laser therapy for kidney stones is a procedure to break up kidney stones into pieces that are small enough to be passed out of the body through urination or removed during the procedure. Follow instructions from your health care provider about eating and drinking before the procedure. During the procedure, the ureteroscope will send images to a video screen to guide your surgeon to the area of your kidney that will be treated. Do not drive  for 24 hours if you were given a sedative during your procedure. This information is not intended to replace advice given to you by your health care provider. Make sure you discuss any questions you have with your health care provider. Document Revised: 07/04/2021 Document Reviewed: 07/04/2021 Elsevier Patient Education  2023 Elsevier Inc.  

## 2022-06-09 ENCOUNTER — Ambulatory Visit: Payer: Medicare HMO | Admitting: Anesthesiology

## 2022-06-09 ENCOUNTER — Ambulatory Visit: Payer: Medicare HMO

## 2022-06-09 ENCOUNTER — Encounter
Admission: RE | Disposition: A | Discharge status: Home / Self Care | Payer: Self-pay | Source: Home / Self Care | Attending: Urology

## 2022-06-09 ENCOUNTER — Encounter: Payer: Self-pay | Admitting: Urology

## 2022-06-09 ENCOUNTER — Ambulatory Visit
Admission: RE | Admit: 2022-06-09 | Discharge: 2022-06-09 | Disposition: A | Discharge status: Home / Self Care | Payer: Medicare HMO | Attending: Urology | Admitting: Urology

## 2022-06-09 DIAGNOSIS — N202 Calculus of kidney with calculus of ureter: Secondary | ICD-10-CM | POA: Insufficient documentation

## 2022-06-09 DIAGNOSIS — Z7984 Long term (current) use of oral hypoglycemic drugs: Secondary | ICD-10-CM | POA: Diagnosis not present

## 2022-06-09 DIAGNOSIS — I252 Old myocardial infarction: Secondary | ICD-10-CM | POA: Insufficient documentation

## 2022-06-09 DIAGNOSIS — N1832 Chronic kidney disease, stage 3b: Secondary | ICD-10-CM | POA: Insufficient documentation

## 2022-06-09 DIAGNOSIS — Q600 Renal agenesis, unilateral: Secondary | ICD-10-CM | POA: Diagnosis not present

## 2022-06-09 DIAGNOSIS — Z01812 Encounter for preprocedural laboratory examination: Secondary | ICD-10-CM

## 2022-06-09 DIAGNOSIS — I129 Hypertensive chronic kidney disease with stage 1 through stage 4 chronic kidney disease, or unspecified chronic kidney disease: Secondary | ICD-10-CM | POA: Diagnosis not present

## 2022-06-09 DIAGNOSIS — Z6832 Body mass index (BMI) 32.0-32.9, adult: Secondary | ICD-10-CM | POA: Insufficient documentation

## 2022-06-09 DIAGNOSIS — Z951 Presence of aortocoronary bypass graft: Secondary | ICD-10-CM | POA: Diagnosis not present

## 2022-06-09 DIAGNOSIS — Z466 Encounter for fitting and adjustment of urinary device: Secondary | ICD-10-CM | POA: Insufficient documentation

## 2022-06-09 DIAGNOSIS — I2510 Atherosclerotic heart disease of native coronary artery without angina pectoris: Secondary | ICD-10-CM | POA: Diagnosis not present

## 2022-06-09 DIAGNOSIS — Z79899 Other long term (current) drug therapy: Secondary | ICD-10-CM | POA: Insufficient documentation

## 2022-06-09 DIAGNOSIS — N201 Calculus of ureter: Secondary | ICD-10-CM

## 2022-06-09 HISTORY — DX: Depression, unspecified: F32.A

## 2022-06-09 HISTORY — DX: Nonrheumatic aortic (valve) insufficiency: I35.1

## 2022-06-09 HISTORY — DX: Chronic kidney disease, stage 3b: N18.32

## 2022-06-09 HISTORY — PX: CYSTOSCOPY W/ URETERAL STENT REMOVAL: SHX1430

## 2022-06-09 HISTORY — DX: Solitary pulmonary nodule: R91.1

## 2022-06-09 HISTORY — PX: CYSTOSCOPY/URETEROSCOPY/HOLMIUM LASER/STENT PLACEMENT: SHX6546

## 2022-06-09 HISTORY — DX: Paroxysmal atrial fibrillation: I48.0

## 2022-06-09 HISTORY — DX: Irritable bowel syndrome, unspecified: K58.9

## 2022-06-09 HISTORY — DX: Type 2 diabetes mellitus with diabetic chronic kidney disease: E11.22

## 2022-06-09 HISTORY — DX: Benign prostatic hyperplasia without lower urinary tract symptoms: N40.0

## 2022-06-09 HISTORY — DX: Personal history of urinary calculi: Z87.442

## 2022-06-09 HISTORY — DX: Pneumonia, unspecified organism: J18.9

## 2022-06-09 LAB — URINALYSIS, COMPLETE
Bilirubin, UA: NEGATIVE
Glucose, UA: NEGATIVE
Ketones, UA: NEGATIVE
Nitrite, UA: NEGATIVE
Specific Gravity, UA: 1.025 (ref 1.005–1.030)
Urobilinogen, Ur: 0.2 mg/dL (ref 0.2–1.0)
pH, UA: 6 (ref 5.0–7.5)

## 2022-06-09 LAB — MICROSCOPIC EXAMINATION
RBC, Urine: >30 /hpf — AB (ref 0–2)
WBC, UA: >30 /hpf — AB (ref 0–5)

## 2022-06-09 LAB — GLUCOSE, CAPILLARY: Glucose-Capillary: 106 mg/dL — ABNORMAL HIGH (ref 70–99)

## 2022-06-09 SURGERY — CYSTOSCOPY/URETEROSCOPY/HOLMIUM LASER/STENT PLACEMENT
Anesthesia: General | Site: Ureter | Laterality: Right

## 2022-06-09 MED ORDER — DEXAMETHASONE SODIUM PHOSPHATE 10 MG/ML IJ SOLN
INTRAMUSCULAR | Status: DC | PRN
  Administered 2022-06-09: 5 mg via INTRAVENOUS

## 2022-06-09 MED ORDER — HYDROCODONE-ACETAMINOPHEN 5-325 MG PO TABS: 1.0000 | ORAL_TABLET | Freq: Four times a day (QID) | ORAL | 0 refills | Status: AC | PRN

## 2022-06-09 MED ORDER — FLUCONAZOLE 100MG IVPB
100.0000 mg | Freq: Once | INTRAVENOUS | Status: AC
  Administered 2022-06-09: 100 mg via INTRAVENOUS
  Filled 2022-06-09: qty 50

## 2022-06-09 MED ORDER — PROPOFOL 10 MG/ML IV BOLUS
INTRAVENOUS | Status: AC
  Filled 2022-06-09: qty 20

## 2022-06-09 MED ORDER — CEFAZOLIN SODIUM-DEXTROSE 2-4 GM/100ML-% IV SOLN
2.0000 g | INTRAVENOUS | Status: AC
  Administered 2022-06-09: 2 g via INTRAVENOUS

## 2022-06-09 MED ORDER — ROCURONIUM BROMIDE 100 MG/10ML IV SOLN
INTRAVENOUS | Status: DC | PRN
  Administered 2022-06-09: 40 mg via INTRAVENOUS

## 2022-06-09 MED ORDER — IOHEXOL 180 MG/ML  SOLN
INTRAMUSCULAR | Status: DC | PRN
  Administered 2022-06-09: 10 mL

## 2022-06-09 MED ORDER — ONDANSETRON HCL 4 MG/2ML IJ SOLN
INTRAMUSCULAR | Status: AC
Start: 2022-06-09 — End: ?
  Filled 2022-06-09: qty 2

## 2022-06-09 MED ORDER — FENTANYL CITRATE (PF) 100 MCG/2ML IJ SOLN
INTRAMUSCULAR | Status: AC
  Filled 2022-06-09: qty 2

## 2022-06-09 MED ORDER — CHLORHEXIDINE GLUCONATE 0.12 % MT SOLN: 15.0000 mL | Freq: Once | OROMUCOSAL | Status: AC

## 2022-06-09 MED ORDER — PROPOFOL 10 MG/ML IV BOLUS
INTRAVENOUS | Status: DC | PRN
  Administered 2022-06-09: 100 mg via INTRAVENOUS
  Administered 2022-06-09: 20 mg via INTRAVENOUS

## 2022-06-09 MED ORDER — ONDANSETRON HCL 4 MG/2ML IJ SOLN: 4.0000 mg | Freq: Once | INTRAMUSCULAR | Status: DC | PRN

## 2022-06-09 MED ORDER — SODIUM CHLORIDE 0.9 % IR SOLN
Status: DC | PRN
  Administered 2022-06-09: 3000 mL via INTRAVESICAL

## 2022-06-09 MED ORDER — DEXAMETHASONE SODIUM PHOSPHATE 10 MG/ML IJ SOLN
INTRAMUSCULAR | Status: AC
  Filled 2022-06-09: qty 1

## 2022-06-09 MED ORDER — SODIUM CHLORIDE 0.9 % IV SOLN: INTRAVENOUS | Status: DC

## 2022-06-09 MED ORDER — FAMOTIDINE 20 MG PO TABS: 20.0000 mg | ORAL_TABLET | Freq: Once | ORAL | Status: AC

## 2022-06-09 MED ORDER — FENTANYL CITRATE (PF) 100 MCG/2ML IJ SOLN
INTRAMUSCULAR | Status: DC | PRN
  Administered 2022-06-09: 50 ug via INTRAVENOUS

## 2022-06-09 MED ORDER — ORAL CARE MOUTH RINSE: 15.0000 mL | Freq: Once | OROMUCOSAL | Status: AC

## 2022-06-09 MED ORDER — ONDANSETRON HCL 4 MG/2ML IJ SOLN
INTRAMUSCULAR | Status: DC | PRN
  Administered 2022-06-09: 4 mg via INTRAVENOUS

## 2022-06-09 MED ORDER — CHLORHEXIDINE GLUCONATE 0.12 % MT SOLN
OROMUCOSAL | Status: AC
  Administered 2022-06-09: 15 mL via OROMUCOSAL
  Filled 2022-06-09: qty 15

## 2022-06-09 MED ORDER — FAMOTIDINE 20 MG PO TABS
ORAL_TABLET | ORAL | Status: AC
  Administered 2022-06-09: 20 mg via ORAL
  Filled 2022-06-09: qty 1

## 2022-06-09 MED ORDER — SUGAMMADEX SODIUM 200 MG/2ML IV SOLN
INTRAVENOUS | Status: DC | PRN
  Administered 2022-06-09: 200 mg via INTRAVENOUS

## 2022-06-09 MED ORDER — LIDOCAINE HCL (PF) 2 % IJ SOLN
INTRAMUSCULAR | Status: AC
  Filled 2022-06-09: qty 5

## 2022-06-09 MED ORDER — CEFAZOLIN SODIUM-DEXTROSE 2-4 GM/100ML-% IV SOLN
INTRAVENOUS | Status: AC
  Filled 2022-06-09: qty 100

## 2022-06-09 MED ORDER — LIDOCAINE HCL (CARDIAC) PF 100 MG/5ML IV SOSY
PREFILLED_SYRINGE | INTRAVENOUS | Status: DC | PRN
  Administered 2022-06-09: 100 mg via INTRAVENOUS

## 2022-06-09 MED ORDER — FENTANYL CITRATE (PF) 100 MCG/2ML IJ SOLN: 25.0000 ug | INTRAMUSCULAR | Status: DC | PRN

## 2022-06-09 SURGICAL SUPPLY — 31 items
ADH LQ OCL WTPRF AMP STRL LF (MISCELLANEOUS)
ADHESIVE MASTISOL STRL (MISCELLANEOUS) IMPLANT
BAG DRAIN CYSTO-URO LG1000N (MISCELLANEOUS) ×3 IMPLANT
BAG PRESSURE INF REUSE 3000 (BAG) ×3 IMPLANT
BRUSH SCRUB EZ 1% IODOPHOR (MISCELLANEOUS) ×3 IMPLANT
CATH URET FLEX-TIP 2 LUMEN 10F (CATHETERS) IMPLANT
CATH URETL OPEN 5X70 (CATHETERS) IMPLANT
CNTNR SPEC 2.5X3XGRAD LEK (MISCELLANEOUS)
CONT SPEC 4OZ STER OR WHT (MISCELLANEOUS)
CONT SPEC 4OZ STRL OR WHT (MISCELLANEOUS)
CONTAINER SPEC 2.5X3XGRAD LEK (MISCELLANEOUS) IMPLANT
DRAPE UTILITY 15X26 TOWEL STRL (DRAPES) ×3 IMPLANT
DRSG TEGADERM 2-3/8X2-3/4 SM (GAUZE/BANDAGES/DRESSINGS) IMPLANT
FIBER LASER MOSES 200 DFL (Laser) ×1 IMPLANT
GLOVE SURG UNDER POLY LF SZ7.5 (GLOVE) ×3 IMPLANT
GOWN STRL REUS W/ TWL LRG LVL3 (GOWN DISPOSABLE) ×2 IMPLANT
GOWN STRL REUS W/ TWL XL LVL3 (GOWN DISPOSABLE) ×2 IMPLANT
GOWN STRL REUS W/TWL LRG LVL3 (GOWN DISPOSABLE) ×3
GOWN STRL REUS W/TWL XL LVL3 (GOWN DISPOSABLE) ×3
GUIDEWIRE STR DUAL SENSOR (WIRE) ×3 IMPLANT
IV NS IRRIG 3000ML ARTHROMATIC (IV SOLUTION) ×3 IMPLANT
KIT TURNOVER CYSTO (KITS) ×3 IMPLANT
PACK CYSTO AR (MISCELLANEOUS) ×3 IMPLANT
SET CYSTO W/LG BORE CLAMP LF (SET/KITS/TRAYS/PACK) ×3 IMPLANT
SHEATH NAVIGATOR HD 12/14X36 (SHEATH) IMPLANT
STENT URET 6FRX24 CONTOUR (STENTS) IMPLANT
STENT URET 6FRX26 CONTOUR (STENTS) IMPLANT
SURGILUBE 2OZ TUBE FLIPTOP (MISCELLANEOUS) ×3 IMPLANT
SYR 10ML LL (SYRINGE) ×3 IMPLANT
VALVE UROSEAL ADJ ENDO (VALVE) ×1 IMPLANT
WATER STERILE IRR 500ML POUR (IV SOLUTION) ×3 IMPLANT

## 2022-06-09 NOTE — Anesthesia Preprocedure Evaluation (Addendum)
Anesthesia Evaluation  Patient identified by MRN, date of birth, ID band Patient awake    Reviewed: Allergy & Precautions, NPO status , Patient's Chart, lab work & pertinent test results  Airway Mallampati: III  TM Distance: >3 FB Neck ROM: full  Mouth opening: Limited Mouth Opening  Dental  (+) Poor Dentition, Missing, Chipped   Pulmonary neg pulmonary ROS,    Pulmonary exam normal  + decreased breath sounds      Cardiovascular Exercise Tolerance: Poor hypertension, Pt. on medications + CAD and + CABG  negative cardio ROS Normal cardiovascular exam Rhythm:Regular     Neuro/Psych Depression negative neurological ROS  negative psych ROS   GI/Hepatic negative GI ROS, Neg liver ROS, GERD  ,  Endo/Other  negative endocrine ROSdiabetes, Well Controlled, Type 2, Oral Hypoglycemic AgentsMorbid obesity  Renal/GU Congenital singular kidney     Musculoskeletal   Abdominal (+) + obese,   Peds negative pediatric ROS (+)  Hematology negative hematology ROS (+)   Anesthesia Other Findings Past Medical History: No date: BPH (benign prostatic hyperplasia) No date: Coronary artery disease No date: Depression No date: GERD (gastroesophageal reflux disease) No date: History of kidney stones No date: Hyperlipidemia No date: Hypertension No date: IBS (irritable bowel syndrome) No date: Moderate aortic valve insufficiency 07/2000: Myocardial infarction [facility]) No date: Paroxysmal atrial fibrillation (HCC) No date: Pneumonia No date: Pulmonary nodule No date: Type 2 diabetes mellitus with stage 3b chronic kidney  disease, without long-term current use of insulin ([place])  Past Surgical History: No date: CARDIAC CATHETERIZATION     Comment:  08/08, 02/28/12, 03/29/12(2 stents) 03/28/2017: CATARACT EXTRACTION W/PHACO; Left     Comment:  Procedure: CATARACT EXTRACTION PHACO AND INTRAOCULAR               LENS PLACEMENT ([place]) LEFT;   Surgeon: Leandrew Koyanagi, MD;  Location: [facility];  Service:               Ophthalmology;  Laterality: Left;  IVA BLOCK LEFT 04/18/2017: CATARACT EXTRACTION W/PHACO; Right     Comment:  Procedure: CATARACT EXTRACTION PHACO AND INTRAOCULAR               LENS PLACEMENT ([place]) Right IVA BLOCK;  Surgeon:               Leandrew Koyanagi, MD;  Location: [facility];              Service: Ophthalmology;  Laterality: Right;  IVA BLOCK 08/03/2005: COLONOSCOPY 07/23/2000: CORONARY ARTERY BYPASS GRAFT     Comment:  1 vessel 05/15/2022: CYSTOSCOPY/URETEROSCOPY/HOLMIUM LASER/STENT PLACEMENT;  Left     Comment:  Procedure: CYSTOSCOPY/URETEROSCOPY/HOLMIUM LASER/STENT               PLACEMENT;  Surgeon: Billey Co, MD;  Location:               [facility];  Service: Urology;  Laterality: Left; 01/08/2006: ESOPHAGOGASTRODUODENOSCOPY 1983: EXPLORATORY LAPAROTOMY  BMI    Body Mass Index: 32.19 kg/m      Reproductive/Obstetrics negative OB ROS                            Anesthesia Physical Anesthesia Plan  ASA: 3  Anesthesia Plan: General   Post-op Pain Management:    Induction: Intravenous  PONV Risk Score and Plan: Ondansetron, Dexamethasone, Midazolam and Treatment  may vary due to age or medical condition  Airway Management Planned: Oral ETT  Additional Equipment:   Intra-op Plan:   Post-operative Plan: Extubation in OR  Informed Consent: I have reviewed the patients History and Physical, chart, labs and discussed the procedure including the risks, benefits and alternatives for the proposed anesthesia with the patient or authorized representative who has indicated his/her understanding and acceptance.     Dental Advisory Given  Plan Discussed with: CRNA and Surgeon  Anesthesia Plan Comments:         Anesthesia Quick Evaluation

## 2022-06-09 NOTE — Op Note (Signed)
Date of procedure: 06/09/22  Preoperative diagnosis:  Left ureteral stent Right ureteral stone Right renal stone  Postoperative diagnosis:  Same  Procedure: Cystoscopy, left ureteral stent removal Right ureteroscopy, laser lithotripsy of renal stones, right ureteral stent placement Right retrograde pyelogram with intraoperative interpretation  Surgeon: Nickolas Madrid, MD  Anesthesia: General  Complications: None  Intraoperative findings:  5 mm right ureteral stone had appeared to pass into the bladder, evacuated through the scope, sent for analysis Uncomplicated left ureteral stent removal, good efflux from left ureter All right renal stones dusted, right ureteral stent placement  EBL: Minimal  Specimens: Stone for analysis  Drains: Right 6 French by 26 cm ureteral stent  Indication: Jonathan Blankenship is a 81 y.o. patient with atrophic right kidney who previously presented with left ureteral stone and acute kidney injury.  Prior to having the left ureteral stent removed in clinic, he developed right-sided flank pain, and had a 5 mm right proximal ureteral stone and failed a trial of medical expulsive therapy and opted to pursue ureteroscopy.  After reviewing the management options for treatment, they elected to proceed with the above surgical procedure(s). We have discussed the potential benefits and risks of the procedure, side effects of the proposed treatment, the likelihood of the patient achieving the goals of the procedure, and any potential problems that might occur during the procedure or recuperation. Informed consent has been obtained.  Description of procedure:  The patient was taken to the operating room and general anesthesia was induced. SCDs were placed for DVT prophylaxis. The patient was placed in the dorsal lithotomy position, prepped and draped in the usual sterile fashion, and preoperative antibiotics were administered. A preoperative time-out was performed.   A  21 French rigid cystoscope was used to intubate the urethra and a normal-appearing urethra was followed proximally into the bladder.  Prostate was small to moderate in size.  Thorough cystoscopy showed a 5 mm black stone at the base of the bladder, and this was evacuated through the scope and sent for analysis.  Suspect this was the previously seen right proximal ureteral stone.  The left ureteral stent was grasped and removed.  A sensor wire advanced easily into the right ureteral orifice and passed up to the atrophic kidney under fluoroscopic vision.  A semirigid ureteroscope was advanced alongside the wire and the ureter was widely patent.  A retrograde pyelogram performed from the proximal ureter showed a small collecting system with an atrophic kidney.  A second safety sensor wire was added.  The digital single-channel flexible ureteroscope was advanced over the wire and thorough pyeloscopy showed a total of 3 stones in the upper mid and lower pole.  A 200 m laser fiber on settings of 0.5 J and 80 Hz was used to methodically dust all the stones.  Thorough pyeloscopy at the conclusion showed no stones larger than the laser fiber.  A retrograde pyelogram from the proximal ureter showed no extravasation or filling defects.  Careful pullback ureteroscopy showed no ureteral injury or stones.  A rigid cystoscope was backloaded over the wire, and a 6 [place] by 26 cm ureteral stent was uneventfully placed with a curl in the lower pole of the right kidney, and under direct vision in the bladder.  There was good efflux from the left ureteral orifice.    Disposition: Stable to PACU  Plan: Must void prior to discharge Stent removal in clinic in ~1 week  Nickolas Madrid, MD

## 2022-06-09 NOTE — H&P (Signed)
06/09/22 11:54 AM   Jonathan Blankenship Oct 08, 1941 [id number]  CC: Bilateral stones  HPI: 81 year old male who presented on 05/15/2022 with left-sided flank pain secondary to a 6 mm left mid ureteral stone with hydronephrosis and AKI with creatinine of 3.0 from a baseline of 1.2.  CT showed an atrophic right kidney with multiple nonobstructing right renal stones at that time.  He underwent left ureteroscopy, laser lithotripsy, stent placement at that time, and was scheduled for follow-up stent removal.  He actually presented to the ER on 05/28/2022 with right-sided flank pain, and CT at that time showed a 4 mm right proximal ureteral stone and the atrophic right kidney, with multiple right nonobstructing renal stones.  Using shared decision making on 05/31/2022, he opted for trial of medical expulsive therapy.  He has continued to have right-sided intermittent flank pain in the low back and right groin, as well as some nausea and vomiting over the last few days.  KUB today shows persistent stones over the right kidney, right proximal ureteral stone not definitively visualized, but this may be uric acid based on CT HU density of 400.  There are also numerous phleboliths in the right pelvis that make interpretation challenging.   With his persistent symptoms, he opted to pursue right ureteroscopy, laser lithotripsy, stent placement, with simultaneous left stent removal. We specifically discussed the risks ureteroscopy including bleeding, infection/sepsis, stent related symptoms including flank pain/urgency/frequency/incontinence/dysuria, ureteral injury, inability to access stone, or need for staged or additional procedures.     PMH: Past Medical History:  Diagnosis Date   BPH (benign prostatic hyperplasia)    Coronary artery disease    Depression    GERD (gastroesophageal reflux disease)    History of kidney stones    Hyperlipidemia    Hypertension    IBS (irritable bowel syndrome)    Moderate  aortic valve insufficiency    Myocardial infarction ([place]) 07/2000   Paroxysmal atrial fibrillation (HCC)    Pneumonia    Pulmonary nodule    Type 2 diabetes mellitus with stage 3b chronic kidney disease, without long-term current use of insulin [facility])     Surgical History: Past Surgical History:  Procedure Laterality Date   CARDIAC CATHETERIZATION     08/08, 02/28/12, 03/29/12(2 stents)   CATARACT EXTRACTION W/PHACO Left 03/28/2017   Procedure: CATARACT EXTRACTION PHACO AND INTRAOCULAR LENS PLACEMENT ([place]) LEFT;  Surgeon: Leandrew Koyanagi, MD;  Location: [place];  Service: Ophthalmology;  Laterality: Left;  IVA BLOCK LEFT   CATARACT EXTRACTION W/PHACO Right 04/18/2017   Procedure: CATARACT EXTRACTION PHACO AND INTRAOCULAR LENS PLACEMENT ([place]) Right IVA BLOCK;  Surgeon: Leandrew Koyanagi, MD;  Location: [place];  Service: Ophthalmology;  Laterality: Right;  IVA BLOCK   COLONOSCOPY  08/03/2005   CORONARY ARTERY BYPASS GRAFT  07/23/2000   1 vessel   CYSTOSCOPY/URETEROSCOPY/HOLMIUM LASER/STENT PLACEMENT Left 05/15/2022   Procedure: CYSTOSCOPY/URETEROSCOPY/HOLMIUM LASER/STENT PLACEMENT;  Surgeon: Billey Co, MD;  Location: [facility];  Service: Urology;  Laterality: Left;   ESOPHAGOGASTRODUODENOSCOPY  01/08/2006   EXPLORATORY LAPAROTOMY  1983     Family History: History reviewed. No pertinent family history.  Social History:  reports that he has never smoked. He has never been exposed to tobacco smoke. He has never used smokeless tobacco. He reports that he does not drink alcohol and does not use drugs.  Physical Exam: BP (!) 148/72   Pulse 73   Temp 98.5 F (36.9 C) (Oral)   Resp 18   Ht 5'  9" (1.753 m)   Wt 98.9 kg   SpO2 98%   BMI 32.19 kg/m    Constitutional:  Alert and oriented, No acute distress. Cardiovascular: RRR Respiratory: CTA bilaterally GI: Abdomen is soft, nontender, nondistended, no abdominal masses  Laboratory  Data: Reviewed Culture 7/13 30k yeast, treated with fluconazole. On bactrim for recent equivocal UA   Assessment & Plan:   81 yo M with atrophic right kidney, recent L URS/LL/stent 05/15/22 for obstructing stone with AKI, developed interval right ureteral stone and right sided pain. Here today for right URS/LL/stent, left stent removal.  We specifically discussed the risks ureteroscopy including bleeding, infection/sepsis, stent related symptoms including flank pain/urgency/frequency/incontinence/dysuria, ureteral injury, inability to access stone, or need for staged or additional procedures.  Cystoscopy, left stent removal, right ureteroscopy, laser lithotripsy, stent placement  Nickolas Madrid, MD 06/09/2022  [facility] [address], [place], [place] [address] [phone]

## 2022-06-09 NOTE — Discharge Instructions (Signed)

## 2022-06-09 NOTE — Anesthesia Postprocedure Evaluation (Signed)
Anesthesia Post Note  Patient: Jonathan Blankenship  Procedure(s) Performed: CYSTOSCOPY/URETEROSCOPY/HOLMIUM LASER/STENT PLACEMENT (Right: Ureter) CYSTOSCOPY WITH STENT REMOVAL (Left: Ureter)  Patient location during evaluation: PACU Anesthesia Type: General Level of consciousness: awake and awake and alert Pain management: pain level controlled Vital Signs Assessment: post-procedure vital signs reviewed and stable Respiratory status: spontaneous breathing and nonlabored ventilation Cardiovascular status: stable Anesthetic complications: no   No notable events documented.   Last Vitals:  Vitals:   06/09/22 1326 06/09/22 1337  BP: (!) 157/74 (!) 154/70  Pulse: 63 74  Resp: 14 16  Temp: (!) 36.2 C (!) 36.4 C  SpO2: 96% 100%    Last Pain:  Vitals:   06/09/22 1337  TempSrc: Temporal  PainSc: 0-No pain                 VAN STAVEREN,Lanai Conlee

## 2022-06-09 NOTE — Transfer of Care (Signed)
Immediate Anesthesia Transfer of Care Note  Patient: Jonathan Blankenship  Procedure(s) Performed: CYSTOSCOPY/URETEROSCOPY/HOLMIUM LASER/STENT PLACEMENT (Right: Ureter) CYSTOSCOPY WITH STENT REMOVAL (Left: Ureter)  Patient Location: PACU  Anesthesia Type:General  Level of Consciousness: drowsy and patient cooperative  Airway & Oxygen Therapy: Patient Spontanous Breathing and Patient connected to face mask oxygen  Post-op Assessment: Report given to RN and Post -op Vital signs reviewed and stable  Post vital signs: Reviewed and stable  Last Vitals:  Vitals Value Taken Time  BP    Temp    Pulse 73 06/09/22 1256  Resp 19 06/09/22 1256  SpO2 100 % 06/09/22 1256    Last Pain:  Vitals:   06/09/22 0944  TempSrc: Oral  PainSc: 0-No pain         Complications: No notable events documented.

## 2022-06-09 NOTE — Anesthesia Procedure Notes (Signed)
Procedure Name: Intubation Date/Time: 06/09/2022 12:07 PM  Performed by: Loletha Grayer, CRNAPre-anesthesia Checklist: Patient identified, Patient being monitored, Timeout performed, Emergency Drugs available and Suction available Patient Re-evaluated:Patient Re-evaluated prior to induction Oxygen Delivery Method: Circle system utilized Preoxygenation: Pre-oxygenation with 100% oxygen Induction Type: IV induction Ventilation: Mask ventilation without difficulty Laryngoscope Size: 3 and McGraph Grade View: Grade I Tube type: Oral Tube size: 7.0 mm Number of attempts: 1 Airway Equipment and Method: Stylet Placement Confirmation: ETT inserted through vocal cords under direct vision, positive ETCO2 and breath sounds checked- equal and bilateral Secured at: 22 cm Tube secured with: Tape Dental Injury: Teeth and Oropharynx as per pre-operative assessment

## 2022-06-10 ENCOUNTER — Encounter: Payer: Self-pay | Admitting: Urology

## 2022-06-13 LAB — CULTURE, URINE COMPREHENSIVE

## 2022-06-15 LAB — CALCULI, WITH PHOTOGRAPH (CLINICAL LAB)
Calcium Oxalate Dihydrate: 10 %
Calcium Oxalate Monohydrate: 90 %
Weight Calculi: 42 mg

## 2022-06-20 ENCOUNTER — Encounter: Payer: Self-pay | Admitting: Urology

## 2022-06-20 ENCOUNTER — Ambulatory Visit: Admission: RE | Admit: 2022-06-20 | Payer: Medicare HMO | Source: Home / Self Care

## 2022-06-20 ENCOUNTER — Other Ambulatory Visit: Payer: Self-pay

## 2022-06-20 ENCOUNTER — Other Ambulatory Visit
Admission: RE | Admit: 2022-06-20 | Discharge: 2022-06-20 | Disposition: A | Discharge status: Home / Self Care | Payer: Medicare HMO | Attending: Urology | Admitting: Urology

## 2022-06-20 ENCOUNTER — Ambulatory Visit (INDEPENDENT_AMBULATORY_CARE_PROVIDER_SITE_OTHER): Payer: Medicare HMO | Admitting: Urology

## 2022-06-20 VITALS — BP 87/51 | HR 80 | Ht 69.0 in | Wt 215.0 lb

## 2022-06-20 DIAGNOSIS — Z466 Encounter for fitting and adjustment of urinary device: Secondary | ICD-10-CM

## 2022-06-20 LAB — URINALYSIS, COMPLETE (UACMP) WITH MICROSCOPIC
Bilirubin Urine: NEGATIVE
Glucose, UA: NEGATIVE mg/dL
Ketones, ur: NEGATIVE mg/dL
Nitrite: NEGATIVE
Protein, ur: >300 mg/dL — AB
RBC / HPF: >50 RBC/hpf (ref 0–5)
Specific Gravity, Urine: 1.025 (ref 1.005–1.030)
WBC, UA: >50 WBC/hpf (ref 0–5)
pH: 5.5 (ref 5.0–8.0)

## 2022-06-20 MED ORDER — CEPHALEXIN 500 MG PO CAPS
500.0000 mg | ORAL_CAPSULE | Freq: Two times a day (BID) | ORAL | 0 refills | Status: DC
Start: 2022-06-20 — End: 2022-10-26

## 2022-06-20 MED ORDER — FLUCONAZOLE 150 MG PO TABS: 150.0000 mg | ORAL_TABLET | Freq: Every day | ORAL | 0 refills | Status: DC

## 2022-06-20 MED ORDER — CEPHALEXIN 250 MG PO CAPS
500.0000 mg | ORAL_CAPSULE | Freq: Once | ORAL | Status: AC
  Administered 2022-06-20: 500 mg via ORAL

## 2022-06-20 MED ORDER — LIDOCAINE HCL URETHRAL/MUCOSAL 2 % EX GEL
1.0000 | Freq: Once | CUTANEOUS | Status: AC
  Administered 2022-06-20: 1 via URETHRAL

## 2022-06-20 NOTE — Patient Instructions (Signed)

## 2022-06-20 NOTE — Progress Notes (Signed)
Cystoscopy Procedure Note:  Indication: Stent removal s/p 05/15/2022 left ureteroscopy/laser lithotripsy, most recently 06/09/2022 right ureteroscopy, laser lithotripsy, stent, left ureteral stent removal  Keflex given for prophylaxis  After informed consent and discussion of the procedure and its risks, Jonathan Blankenship was positioned and prepped in the standard fashion. Cystoscopy was performed with a flexible cystoscope. The stent was grasped with flexible graspers and removed in its entirety. The patient tolerated the procedure well.  Findings: Uncomplicated stent removal  Assessment and Plan: Fluconazole 150 mg daily x7 days for yeast Keflex 500 mg twice daily x5 days, follow-up urine culture RTC 4 months KUB   Jonathan Co, MD 06/20/2022

## 2022-10-26 ENCOUNTER — Ambulatory Visit
Admission: RE | Admit: 2022-10-26 | Discharge: 2022-10-26 | Disposition: A | Discharge status: Home / Self Care | Payer: Medicare HMO | Source: Ambulatory Visit | Attending: Urology | Admitting: Urology

## 2022-10-26 ENCOUNTER — Encounter: Payer: Self-pay | Admitting: Urology

## 2022-10-26 ENCOUNTER — Ambulatory Visit (INDEPENDENT_AMBULATORY_CARE_PROVIDER_SITE_OTHER): Payer: Medicare HMO | Admitting: Urology

## 2022-10-26 VITALS — BP 134/74 | HR 65 | Ht 68.0 in | Wt 215.0 lb

## 2022-10-26 DIAGNOSIS — Z466 Encounter for fitting and adjustment of urinary device: Secondary | ICD-10-CM | POA: Diagnosis present

## 2022-10-26 DIAGNOSIS — N200 Calculus of kidney: Secondary | ICD-10-CM

## 2022-10-26 DIAGNOSIS — Z87442 Personal history of urinary calculi: Secondary | ICD-10-CM | POA: Diagnosis not present

## 2022-10-26 NOTE — Patient Instructions (Signed)

## 2022-10-26 NOTE — Progress Notes (Signed)
   10/26/2022 12:59 PM   Jonathan Blankenship Jan 20, 1941 [id number]  Reason for visit: Follow up nephrolithiasis  HPI: 81 year old male with atrophic right kidney who presented in July with left-sided flank pain and CT showing a 6 mm left mid ureteral stone with hydronephrosis, and also noted to have acute kidney injury(creatinine 3, EGFR 20).  He underwent uncomplicated left ureteroscopy at that time.  He actually presented a few days later to the ER and left ureteral stent was in appropriate position, however he had dropped a new stone on the right side in his atrophic kidney to the proximal ureter showing hydronephrosis.  He failed a trial of medical expulsive therapy, and ultimately he underwent 06/09/2022 left ureteral stent removal, right ureteroscopy, laser lithotripsy, stent placement, and right ureteral stent was removed on 06/20/2022.  Stone analysis showed calcium oxalate.  He has done well since that time.  Recent CMP with PCP on 10/11/2022 showed essentially normal renal function with creatinine 1.3, EGFR 56, which is at his baseline creatinine of 1.25.  PSA was also normal at 1.05.  He is past the age of need for routine screening.  Denies any stone episodes since our last visit.  I personally viewed and interpreted his KUB today that shows no definitive evidence of stone disease.  We discussed general stone prevention strategies including adequate hydration with goal of producing 2.5 L of urine daily, increasing citric acid intake, increasing calcium intake during high oxalate meals, minimizing animal protein, and decreasing salt intake. Information about dietary recommendations given today.   RTC 1 year [place], MD  [facility] [address], [place], [place] [address] [phone]

## 2022-12-21 ENCOUNTER — Ambulatory Visit: Payer: Medicare HMO | Admitting: Urology

## 2022-12-21 ENCOUNTER — Ambulatory Visit (INDEPENDENT_AMBULATORY_CARE_PROVIDER_SITE_OTHER): Payer: Medicare HMO | Admitting: Dermatology

## 2022-12-21 ENCOUNTER — Encounter: Payer: Self-pay | Admitting: Dermatology

## 2022-12-21 VITALS — BP 129/64 | HR 63

## 2022-12-21 DIAGNOSIS — R238 Other skin changes: Secondary | ICD-10-CM

## 2022-12-21 DIAGNOSIS — B009 Herpesviral infection, unspecified: Secondary | ICD-10-CM | POA: Diagnosis not present

## 2022-12-21 DIAGNOSIS — L820 Inflamed seborrheic keratosis: Secondary | ICD-10-CM | POA: Diagnosis not present

## 2022-12-21 DIAGNOSIS — L570 Actinic keratosis: Secondary | ICD-10-CM

## 2022-12-21 MED ORDER — VALACYCLOVIR HCL 500 MG PO TABS: ORAL_TABLET | ORAL | 1 refills | Status: DC

## 2022-12-21 NOTE — Progress Notes (Signed)
   New Patient Visit  Subjective  Jonathan Blankenship is a 82 y.o. male who presents for the following: Skin Problem (Check lesion on lower lip. Dentist noticed and recommended having dermatologist check it).  He gets cold sores sometimes often.  The patient has spots, moles and lesions to be evaluated, some may be new or changing and the patient has concerns that these could be cancer.  Review of Systems: No other skin or systemic complaints except as noted in HPI or Assessment and Plan.   Objective  Well appearing patient in no apparent distress; mood and affect are within normal limits.  A focused examination was performed including face. Relevant physical exam findings are noted in the Assessment and Plan.  Left Lower Lip 0.2 cm light purple macule     Left Lower Lip Erythematous thin papules/macules with gritty scale.   Left Upper Back Erythematous keratotic or waxy stuck-on papule or plaque.  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  Recheck in approximately 2 months.  Call clinic for new or changing lesions.  Recommend daily use of broad spectrum spf 30+ sunscreen to sun-exposed areas.    AK (actinic keratosis) Left Lower Lip  Actinic keratoses are precancerous spots that appear secondary to cumulative UV radiation exposure/sun exposure over time. They are chronic with expected duration over 1 year. A portion of actinic keratoses will progress to squamous cell carcinoma of the skin. It is not possible to reliably predict which spots will progress to skin cancer and so treatment is recommended to prevent development of skin cancer.  Recommend daily broad spectrum sunscreen or lip balm SPF 30+ to sun-exposed areas, reapply every 2 hours as needed.  Recommend staying in the shade or wearing long sleeves, sun glasses (UVA+UVB protection) and wide brim hats (4-inch brim  around the entire circumference of the hat). Call for new or changing lesions.  At lower lip. Plan treatment at next visit after starting Valacyclovir  Inflamed seborrheic keratosis Left Upper Back  Patient deferred treatment at this time.  Benign-appearing.  Observation.  Call clinic for new or changing lesions.   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 Condition is symptomatic/ bothersome to patient. Not currently at goal.  History of HSV infection, recurrent, sometimes bothersome.  Start valacyclovir 500 mg twice a day 2 days before cryotherapy appointment for lip.  Rx Valacyclovir 500 bid x 10 days with glass of water  May consider treatment dose valacyclovir or suppressive therapy in future.  valACYclovir (VALTREX) 500 MG tablet - Mid Lower Vermilion Lip twice a day 10 days with glass of water. Start 2 days before cryotherapy appointment for lip.   Return for AK treatment in 2-3 weeks.  I, Emelia Salisbury, CMA, am acting as scribe for Forest Gleason, MD.  Documentation: I have reviewed the above documentation for accuracy and completeness, and I agree with the above.  Forest Gleason, MD

## 2022-12-21 NOTE — Patient Instructions (Addendum)
Start 2 days before cryotherapy appointment for lip. Valacyclovir: twice a day 10 days with glass of water.   Recommend daily broad spectrum sunscreen SPF 30+ to sun-exposed areas, reapply every 2 hours as needed. Call for new or changing lesions.  Staying in the shade or wearing long sleeves, sun glasses (UVA+UVB protection) and wide brim hats (4-inch brim around the entire circumference of the hat) are also recommended for sun protection.     Spot on back:  Seborrheic Keratosis  What causes seborrheic keratoses? Seborrheic keratoses are harmless, common skin growths that first appear during adult life.  As time goes by, more growths appear.  Some people may develop a large number of them.  Seborrheic keratoses appear on both covered and uncovered body parts.  They are not caused by sunlight.  The tendency to develop seborrheic keratoses can be inherited.  They vary in color from skin-colored to gray, brown, or even black.  They can be either smooth or have a rough, warty surface.   Seborrheic keratoses are superficial and look as if they were stuck on the skin.  Under the microscope this type of keratosis looks like layers upon layers of skin.  That is why at times the top layer may seem to fall off, but the rest of the growth remains and re-grows.    Treatment Seborrheic keratoses do not need to be treated, but can easily be removed in the office.  Seborrheic keratoses often cause symptoms when they rub on clothing or jewelry.  Lesions can be in the way of shaving.  If they become inflamed, they can cause itching, soreness, or burning.  Removal of a seborrheic keratosis can be accomplished by freezing, burning, or surgery. If any spot bleeds, scabs, or grows rapidly, please return to have it checked, as these can be an indication of a skin cancer.   Due to recent changes in healthcare laws, you may see results of your pathology and/or laboratory studies on MyChart before the doctors have had  a chance to review them. We understand that in some cases there may be results that are confusing or concerning to you. Please understand that not all results are received at the same time and often the doctors may need to interpret multiple results in order to provide you with the best plan of care or course of treatment. Therefore, we ask that you please give [place] 2 business days to thoroughly review all your results before contacting the office for clarification. Should we see a critical lab result, you will be contacted sooner.   If You Need Anything After Your Visit  If you have any questions or concerns for your doctor, please call our main line at [phone] and press option 4 to reach your doctor's medical assistant. If no one answers, please leave a voicemail as directed and we will return your call as soon as possible. Messages left after 4 pm will be answered the following business day.   You may also send [place] a message via [place]. We typically respond to MyChart messages within 1-2 business days.  For prescription refills, please ask your pharmacy to contact our office. Our fax number is [phone].  If you have an urgent issue when the clinic is closed that cannot wait until the next business day, you can page your doctor at the number below.    Please note that while we do our best to be available for urgent issues outside of office hours, we are not  available 24/7.   If you have an urgent issue and are unable to reach [place], you may choose to seek medical care at your doctor's office, retail clinic, urgent care center, or emergency room.  If you have a medical emergency, please immediately call 911 or go to the emergency department.  Pager Numbers  - Dr. Nehemiah Massed: [phone]  - Dr. Laurence Ferrari: [phone]  - Dr. Nicole Kindred: [phone]  In the event of inclement weather, please call our main line at [phone] for an update on the status of any delays or closures.  Dermatology  Medication Tips: Please keep the boxes that topical medications come in in order to help keep track of the instructions about where and how to use these. Pharmacies typically print the medication instructions only on the boxes and not directly on the medication tubes.   If your medication is too expensive, please contact our office at [phone] option 4 or send [place] a message through [place].   We are unable to tell what your co-pay for medications will be in advance as this is different depending on your insurance coverage. However, we may be able to find a substitute medication at lower cost or fill out paperwork to get insurance to cover a needed medication.   If a prior authorization is required to get your medication covered by your insurance company, please allow [place] 1-2 business days to complete this process.  Drug prices often vary depending on where the prescription is filled and some pharmacies may offer cheaper prices.  The website www.goodrx.com contains coupons for medications through different pharmacies. The prices here do not account for what the cost may be with help from insurance (it may be cheaper with your insurance), but the website can give you the price if you did not use any insurance.  - You can print the associated coupon and take it with your prescription to the pharmacy.  - You may also stop by our office during regular business hours and pick up a GoodRx coupon card.  - If you need your prescription sent electronically to a different pharmacy, notify our office through [facility] or by phone at [phone] option 4.     Si Usted Necesita Algo Despus de Su Visita  Tambin puede enviarnos un mensaje a travs de Pharmacist, community. Por lo general respondemos a los mensajes de MyChart en el transcurso de 1 a 2 das hbiles.  Para renovar recetas, por favor pida a su farmacia que se ponga en contacto con nuestra oficina. Harland Dingwall de fax es [place] [phone].  Si  tiene un asunto urgente cuando la clnica est cerrada y que no puede esperar hasta el siguiente da hbil, puede llamar/localizar a su doctor(a) al nmero que aparece a continuacin.   Por favor, tenga en cuenta que aunque hacemos todo lo posible para estar disponibles para asuntos urgentes fuera del horario de [place], no estamos disponibles las 24 horas del da, los 7 das de la [place].   Si tiene un problema urgente y no puede comunicarse con nosotros, puede optar por buscar atencin mdica  en el consultorio de su doctor(a), en una clnica privada, en un centro de atencin urgente o en una sala de emergencias.  Si tiene Engineering geologist, por favor llame inmediatamente al 911 o vaya a la sala de emergencias.  Nmeros de bper  - Dr. Nehemiah Massed: [phone]  - Dra. Moye: [phone]  - Dra. Nicole Kindred: [phone]  En caso de inclemencias del tiempo, por favor llame  a Johnsie Kindred principal al [phone] para una actualizacin sobre el [place] de cualquier retraso o cierre.  Consejos para la medicacin en dermatologa: Por favor, guarde las cajas en las que vienen los medicamentos de uso tpico para ayudarle a seguir las instrucciones sobre dnde y cmo usarlos. Las farmacias generalmente imprimen las instrucciones del medicamento slo en las cajas y no directamente en los tubos del [place].   Si su medicamento es muy caro, por favor, pngase en contacto con Zigmund Daniel llamando al [phone] y presione la opcin 4 o envenos un mensaje a travs de Pharmacist, community.   No podemos decirle cul ser su copago por los medicamentos por adelantado ya que esto es diferente dependiendo de la cobertura de su seguro. Sin embargo, es posible que podamos encontrar un medicamento sustituto a Electrical engineer un formulario para que el seguro cubra el medicamento que se considera necesario.   Si se requiere una autorizacin previa para que su compaa de seguros [place] su medicamento, por favor  permtanos de 1 a 2 das hbiles para completar este proceso.  Los precios de los medicamentos varan con frecuencia dependiendo del Environmental consultant de dnde se surte la receta y alguna farmacias pueden ofrecer precios ms baratos.  El sitio web www.goodrx.com tiene cupones para medicamentos de Airline pilot. Los precios aqu no tienen en cuenta lo que podra costar con la ayuda del seguro (puede ser ms barato con su seguro), pero el sitio web puede darle el precio si no utiliz Research scientist (physical sciences).  - Puede imprimir el cupn correspondiente y llevarlo con su receta a la farmacia.  - Tambin puede pasar por nuestra oficina durante el horario de atencin regular y Charity fundraiser una tarjeta de cupones de GoodRx.  - Si necesita que su receta se enve electrnicamente a una farmacia diferente, informe a nuestra oficina a travs de MyChart de [place] o por telfono llamando al [phone] y presione la opcin 4.

## 2023-01-11 ENCOUNTER — Encounter: Payer: Self-pay | Admitting: Dermatology

## 2023-01-11 ENCOUNTER — Ambulatory Visit (INDEPENDENT_AMBULATORY_CARE_PROVIDER_SITE_OTHER): Payer: Medicare HMO | Admitting: Dermatology

## 2023-01-11 VITALS — BP 147/74 | HR 68

## 2023-01-11 DIAGNOSIS — L570 Actinic keratosis: Secondary | ICD-10-CM | POA: Diagnosis not present

## 2023-01-11 NOTE — Patient Instructions (Addendum)
Cryotherapy Aftercare  Wash gently with soap and water everyday.   Apply Vaseline daily until healed.   Continue Valacyclovir 500 mg twice daily until finished.      Recommend daily broad spectrum sunscreen SPF 30+ to sun-exposed areas, reapply every 2 hours as needed. Call for new or changing lesions.  Staying in the shade or wearing long sleeves, sun glasses (UVA+UVB protection) and wide brim hats (4-inch brim around the entire circumference of the hat) are also recommended for sun protection.    Due to recent changes in healthcare laws, you may see results of your pathology and/or laboratory studies on MyChart before the doctors have had a chance to review them. We understand that in some cases there may be results that are confusing or concerning to you. Please understand that not all results are received at the same time and often the doctors may need to interpret multiple results in order to provide you with the best plan of care or course of treatment. Therefore, we ask that you please give [place] 2 business days to thoroughly review all your results before contacting the office for clarification. Should we see a critical lab result, you will be contacted sooner.   If You Need Anything After Your Visit  If you have any questions or concerns for your doctor, please call our main line at [phone] and press option 4 to reach your doctor's medical assistant. If no one answers, please leave a voicemail as directed and we will return your call as soon as possible. Messages left after 4 pm will be answered the following business day.   You may also send [place] a message via [place]. We typically respond to MyChart messages within 1-2 business days.  For prescription refills, please ask your pharmacy to contact our office. Our fax number is [phone].  If you have an urgent issue when the clinic is closed that cannot wait until the next business day, you can page your doctor at the number  below.    Please note that while we do our best to be available for urgent issues outside of office hours, we are not available 24/7.   If you have an urgent issue and are unable to reach [place], you may choose to seek medical care at your doctor's office, retail clinic, urgent care center, or emergency room.  If you have a medical emergency, please immediately call 911 or go to the emergency department.  Pager Numbers  - Dr. Nehemiah Massed: [phone]  - Dr. Laurence Ferrari: [phone]  - Dr. Nicole Kindred: [phone]  In the event of inclement weather, please call our main line at [phone] for an update on the status of any delays or closures.  Dermatology Medication Tips: Please keep the boxes that topical medications come in in order to help keep track of the instructions about where and how to use these. Pharmacies typically print the medication instructions only on the boxes and not directly on the medication tubes.   If your medication is too expensive, please contact our office at [phone] option 4 or send [place] a message through [place].   We are unable to tell what your co-pay for medications will be in advance as this is different depending on your insurance coverage. However, we may be able to find a substitute medication at lower cost or fill out paperwork to get insurance to cover a needed medication.   If a prior authorization is required to get your medication covered by your insurance company, please  allow [place] 1-2 business days to complete this process.  Drug prices often vary depending on where the prescription is filled and some pharmacies may offer cheaper prices.  The website www.goodrx.com contains coupons for medications through different pharmacies. The prices here do not account for what the cost may be with help from insurance (it may be cheaper with your insurance), but the website can give you the price if you did not use any insurance.  - You can print the associated coupon  and take it with your prescription to the pharmacy.  - You may also stop by our office during regular business hours and pick up a GoodRx coupon card.  - If you need your prescription sent electronically to a different pharmacy, notify our office through [facility] or by phone at [phone] option 4.     Si Usted Necesita Algo Despus de Su Visita  Tambin puede enviarnos un mensaje a travs de Pharmacist, community. Por lo general respondemos a los mensajes de MyChart en el transcurso de 1 a 2 das hbiles.  Para renovar recetas, por favor pida a su farmacia que se ponga en contacto con nuestra oficina. Harland Dingwall de fax es [place] [phone].  Si tiene un asunto urgente cuando la clnica est cerrada y que no puede esperar hasta el siguiente da hbil, puede llamar/localizar a su doctor(a) al nmero que aparece a continuacin.   Por favor, tenga en cuenta que aunque hacemos todo lo posible para estar disponibles para asuntos urgentes fuera del horario de [place], no estamos disponibles las 24 horas del da, los 7 das de la [place].   Si tiene un problema urgente y no puede comunicarse con nosotros, puede optar por buscar atencin mdica  en el consultorio de su doctor(a), en una clnica privada, en un centro de atencin urgente o en una sala de emergencias.  Si tiene Engineering geologist, por favor llame inmediatamente al 911 o vaya a la sala de emergencias.  Nmeros de bper  - Dr. Nehemiah Massed: [phone]  - Dra. Moye: [phone]  - Dra. Nicole Kindred: [phone]  En caso de inclemencias del [place], por favor llame a Johnsie Kindred principal al [phone] para una actualizacin sobre el [place] de cualquier retraso o cierre.  Consejos para la medicacin en dermatologa: Por favor, guarde las cajas en las que vienen los medicamentos de uso tpico para ayudarle a seguir las instrucciones sobre dnde y cmo usarlos. Las farmacias generalmente imprimen las instrucciones del medicamento  slo en las cajas y no directamente en los tubos del [place].   Si su medicamento es muy caro, por favor, pngase en contacto con Zigmund Daniel llamando al [phone] y presione la opcin 4 o envenos un mensaje a travs de Pharmacist, community.   No podemos decirle cul ser su copago por los medicamentos por adelantado ya que esto es diferente dependiendo de la cobertura de su seguro. Sin embargo, es posible que podamos encontrar un medicamento sustituto a Electrical engineer un formulario para que el seguro cubra el medicamento que se considera necesario.   Si se requiere una autorizacin previa para que su compaa de seguros [place] su medicamento, por favor permtanos de 1 a 2 das hbiles para completar este proceso.  Los precios de los medicamentos varan con frecuencia dependiendo del Environmental consultant de dnde se surte la receta y alguna farmacias pueden ofrecer precios ms baratos.  El sitio web www.goodrx.com tiene cupones para medicamentos de Airline pilot. Los precios aqu no tienen en cuenta lo que  podra costar con la ayuda del seguro (puede ser ms barato con su seguro), pero el sitio web puede darle el precio si no Field seismologist.  - Puede imprimir el cupn correspondiente y llevarlo con su receta a la farmacia.  - Tambin puede pasar por nuestra oficina durante el horario de atencin regular y Charity fundraiser una tarjeta de cupones de GoodRx.  - Si necesita que su receta se enve electrnicamente a una farmacia diferente, informe a nuestra oficina a travs de MyChart de [employer] o por telfono llamando al [phone] y presione la opcin 4.

## 2023-01-11 NOTE — Progress Notes (Signed)
   Follow-Up Visit   Subjective  Jonathan Blankenship is a 82 y.o. male who presents for the following: Actinic Keratosis (Spot at left lower lip that needs treatment ).  The patient has spots, moles and lesions to be evaluated, some may be new or changing and the patient has concerns that these could be cancer.  The following portions of the chart were reviewed this encounter and updated as appropriate:  Tobacco  Allergies  Meds  Problems  Med Hx  Surg Hx  Fam Hx      Review of Systems: No other skin or systemic complaints except as noted in HPI or Assessment and Plan.   Objective  Well appearing patient in no apparent distress; mood and affect are within normal limits.  A focused examination was performed including face, lips. Relevant physical exam findings are noted in the Assessment and Plan.  lower lip Erythematous thin papules/macules with gritty scale.    Assessment & Plan  AK (actinic keratosis) lower lip  Actinic keratoses are precancerous spots that appear secondary to cumulative UV radiation exposure/sun exposure over time. They are chronic with expected duration over 1 year. A portion of actinic keratoses will progress to squamous cell carcinoma of the skin. It is not possible to reliably predict which spots will progress to skin cancer and so treatment is recommended to prevent development of skin cancer.  Recommend daily broad spectrum sunscreen SPF 30+ to sun-exposed areas, reapply every 2 hours as needed.  Recommend staying in the shade or wearing long sleeves, sun glasses (UVA+UVB protection) and wide brim hats (4-inch brim around the entire circumference of the hat). Call for new or changing lesions.   Continue Valacyclovir 500 mg twice daily with glass of water until finished.   Destruction of lesion - lower lip  Destruction method: cryotherapy   Informed consent: discussed and consent obtained   Lesion destroyed using liquid nitrogen: Yes   Region  frozen until ice ball extended beyond lesion: Yes   Outcome: patient tolerated procedure well with no complications   Post-procedure details: wound care instructions given   Additional details:  Prior to procedure, discussed risks of blister formation, small wound, skin dyspigmentation, or rare scar following cryotherapy. Recommend Vaseline ointment to treated areas while healing.    Return for AK Follow Up in 2- 3 months .  I, Emelia Salisbury, CMA, am acting as scribe for Forest Gleason, MD.   Documentation: I have reviewed the above documentation for accuracy and completeness, and I agree with the above.  Forest Gleason, MD

## 2023-02-21 ENCOUNTER — Telehealth: Payer: Self-pay

## 2023-02-21 NOTE — Telephone Encounter (Signed)
LVM asking patient to call office to reschedule appt on 4/25. Zanyiah Posten S., RMA 

## 2023-03-07 ENCOUNTER — Ambulatory Visit (INDEPENDENT_AMBULATORY_CARE_PROVIDER_SITE_OTHER): Payer: Medicare HMO | Admitting: Dermatology

## 2023-03-07 VITALS — BP 138/80 | HR 73

## 2023-03-07 DIAGNOSIS — Z872 Personal history of diseases of the skin and subcutaneous tissue: Secondary | ICD-10-CM

## 2023-03-07 DIAGNOSIS — L578 Other skin changes due to chronic exposure to nonionizing radiation: Secondary | ICD-10-CM | POA: Diagnosis not present

## 2023-03-07 NOTE — Patient Instructions (Signed)
Due to recent changes in healthcare laws, you may see results of your pathology and/or laboratory studies on MyChart before the doctors have had a chance to review them. We understand that in some cases there may be results that are confusing or concerning to you. Please understand that not all results are received at the same time and often the doctors may need to interpret multiple results in order to provide you with the best plan of care or course of treatment. Therefore, we ask that you please give us 2 business days to thoroughly review all your results before contacting the office for clarification. Should we see a critical lab result, you will be contacted sooner.   If You Need Anything After Your Visit  If you have any questions or concerns for your doctor, please call our main line at [phone] and press option 4 to reach your doctor's medical assistant. If no one answers, please leave a voicemail as directed and we will return your call as soon as possible. Messages left after 4 pm will be answered the following business day.   You may also send us a message via MyChart. We typically respond to MyChart messages within 1-2 business days.  For prescription refills, please ask your pharmacy to contact our office. Our fax number is [phone].  If you have an urgent issue when the clinic is closed that cannot wait until the next business day, you can page your doctor at the number below.    Please note that while we do our best to be available for urgent issues outside of office hours, we are not available 24/7.   If you have an urgent issue and are unable to reach us, you may choose to seek medical care at your doctor's office, retail clinic, urgent care center, or emergency room.  If you have a medical emergency, please immediately call 911 or go to the emergency department.  Pager Numbers  - Dr. Kowalski: [phone]  - Dr. Moye: [phone]  - Dr. Stewart:  [phone]  In the event of inclement weather, please call our main line at [phone] for an update on the status of any delays or closures.  Dermatology Medication Tips: Please keep the boxes that topical medications come in in order to help keep track of the instructions about where and how to use these. Pharmacies typically print the medication instructions only on the boxes and not directly on the medication tubes.   If your medication is too expensive, please contact our office at [phone] option 4 or send us a message through MyChart.   We are unable to tell what your co-pay for medications will be in advance as this is different depending on your insurance coverage. However, we may be able to find a substitute medication at lower cost or fill out paperwork to get insurance to cover a needed medication.   If a prior authorization is required to get your medication covered by your insurance company, please allow us 1-2 business days to complete this process.  Drug prices often vary depending on where the prescription is filled and some pharmacies may offer cheaper prices.  The website www.goodrx.com contains coupons for medications through different pharmacies. The prices here do not account for what the cost may be with help from insurance (it may be cheaper with your insurance), but the website can give you the price if you did not use any insurance.  - You can print the associated coupon and take it with   your prescription to the pharmacy.  - You may also stop by our office during regular business hours and pick up a GoodRx coupon card.  - If you need your prescription sent electronically to a different pharmacy, notify our office through [place] MyChart or by phone at [phone] option 4.     Si Usted Necesita Algo Despus de Su Visita  Tambin puede enviarnos un mensaje a travs de MyChart. Por lo general respondemos a los mensajes de MyChart en el transcurso de 1 a 2  das hbiles.  Para renovar recetas, por favor pida a su farmacia que se ponga en contacto con nuestra oficina. Nuestro nmero de fax es el [phone].  Si tiene un asunto urgente cuando la clnica est cerrada y que no puede esperar hasta el siguiente da hbil, puede llamar/localizar a su doctor(a) al nmero que aparece a continuacin.   Por favor, tenga en cuenta que aunque hacemos todo lo posible para estar disponibles para asuntos urgentes fuera del horario de oficina, no estamos disponibles las 24 horas del da, los 7 das de la semana.   Si tiene un problema urgente y no puede comunicarse con nosotros, puede optar por buscar atencin mdica  en el consultorio de su doctor(a), en una clnica privada, en un centro de atencin urgente o en una sala de emergencias.  Si tiene una emergencia mdica, por favor llame inmediatamente al 911 o vaya a la sala de emergencias.  Nmeros de bper  - Dr. Kowalski: [phone]  - Dra. Moye: [phone]  - Dra. Stewart: [phone]  En caso de inclemencias del tiempo, por favor llame a nuestra lnea principal al [phone] para una actualizacin sobre el estado de cualquier retraso o cierre.  Consejos para la medicacin en dermatologa: Por favor, guarde las cajas en las que vienen los medicamentos de uso tpico para ayudarle a seguir las instrucciones sobre dnde y cmo usarlos. Las farmacias generalmente imprimen las instrucciones del medicamento slo en las cajas y no directamente en los tubos del medicamento.   Si su medicamento es muy caro, por favor, pngase en contacto con nuestra oficina llamando al [phone] y presione la opcin 4 o envenos un mensaje a travs de MyChart.   No podemos decirle cul ser su copago por los medicamentos por adelantado ya que esto es diferente dependiendo de la cobertura de su seguro. Sin embargo, es posible que podamos encontrar un medicamento sustituto a menor costo o llenar un formulario para que el  seguro cubra el medicamento que se considera necesario.   Si se requiere una autorizacin previa para que su compaa de seguros cubra su medicamento, por favor permtanos de 1 a 2 das hbiles para completar este proceso.  Los precios de los medicamentos varan con frecuencia dependiendo del lugar de dnde se surte la receta y alguna farmacias pueden ofrecer precios ms baratos.  El sitio web www.goodrx.com tiene cupones para medicamentos de diferentes farmacias. Los precios aqu no tienen en cuenta lo que podra costar con la ayuda del seguro (puede ser ms barato con su seguro), pero el sitio web puede darle el precio si no utiliz ningn seguro.  - Puede imprimir el cupn correspondiente y llevarlo con su receta a la farmacia.  - Tambin puede pasar por nuestra oficina durante el horario de atencin regular y recoger una tarjeta de cupones de GoodRx.  - Si necesita que su receta se enve electrnicamente a una farmacia diferente, informe a nuestra oficina a travs de MyChart de [place]   o por telfono llamando al [phone] y presione la opcin 4.  

## 2023-03-07 NOTE — Progress Notes (Signed)
   Follow-Up Visit   Subjective  Jonathan Blankenship is a 82 y.o. male who presents for the following: AK follow up of the lower lip previously treated with LN2   The following portions of the chart were reviewed this encounter and updated as appropriate: medications, allergies, medical history  Review of Systems:  No other skin or systemic complaints except as noted in HPI or Assessment and Plan.  Objective  Well appearing patient in no apparent distress; mood and affect are within normal limits.   A focused examination was performed of the following areas: the face and lip    Relevant exam findings are noted in the Assessment and Plan.    Assessment & Plan   ACTINIC DAMAGE - chronic, secondary to cumulative UV radiation exposure/sun exposure over time - diffuse scaly erythematous macules with underlying dyspigmentation - Recommend daily broad spectrum sunscreen SPF 30+ to sun-exposed areas, reapply every 2 hours as needed.  - Recommend staying in the shade or wearing long sleeves, sun glasses (UVA+UVB protection) and wide brim hats (4-inch brim around the entire circumference of the hat). - Call for new or changing lesions.  HISTORY OF PRECANCEROUS ACTINIC KERATOSIS - lower lip - site(s) of PreCancerous Actinic Keratosis clear today. - these may recur and new lesions may form requiring treatment to prevent transformation into skin cancer - observe for new or changing spots and contact [facility] for appointment if occur - photoprotection with sun protective clothing; sunglasses and broad spectrum sunscreen with SPF of at least 30 + and frequent self skin exams recommended - yearly exams by a dermatologist recommended for persons with history of PreCancerous Actinic Keratoses - Recommend lip balm with sunscreen daily.   Return in about 1 year (around 03/06/2024) for sun exposed areas recheck.  Maylene Roes, CMA, am acting as scribe for Darden Dates, MD    Documentation: I have reviewed the above documentation for accuracy and completeness, and I agree with the above.  Darden Dates, MD

## 2023-03-08 ENCOUNTER — Ambulatory Visit: Payer: Medicare HMO | Admitting: Dermatology

## 2023-10-25 ENCOUNTER — Ambulatory Visit
Admission: RE | Admit: 2023-10-25 | Discharge: 2023-10-25 | Disposition: A | Discharge status: Home / Self Care | Payer: Medicare HMO | Attending: Urology | Admitting: Urology

## 2023-10-25 ENCOUNTER — Ambulatory Visit
Admission: RE | Admit: 2023-10-25 | Discharge: 2023-10-25 | Disposition: A | Discharge status: Home / Self Care | Payer: Medicare HMO | Source: Ambulatory Visit | Attending: Urology | Admitting: Urology

## 2023-10-25 ENCOUNTER — Ambulatory Visit (INDEPENDENT_AMBULATORY_CARE_PROVIDER_SITE_OTHER): Payer: Medicare HMO | Admitting: Physician Assistant

## 2023-10-25 ENCOUNTER — Ambulatory Visit: Payer: Medicare HMO | Admitting: Urology

## 2023-10-25 VITALS — BP 84/79 | HR 80

## 2023-10-25 DIAGNOSIS — Z87440 Personal history of urinary (tract) infections: Secondary | ICD-10-CM

## 2023-10-25 DIAGNOSIS — R109 Unspecified abdominal pain: Secondary | ICD-10-CM

## 2023-10-25 DIAGNOSIS — N200 Calculus of kidney: Secondary | ICD-10-CM

## 2023-10-25 NOTE — Progress Notes (Signed)
10/25/2023 2:16 PM   Norman Herrlich 10-Nov-1941 [id number]  CC: No chief complaint on file.  HPI: SOPHEAP GOETTER is a 82 y.o. male with PMH atrophic right kidney and nephrolithiasis who presents today for annual follow-up.   Today he reports chronic, stable bilateral flank pain for the past year.  No dysuria or gross hematuria.  KUB today with no radiopaque urolithiasis.  PMH: Past Medical History:  Diagnosis Date   BPH (benign prostatic hyperplasia)    Coronary artery disease    Depression    GERD (gastroesophageal reflux disease)    History of kidney stones    Hyperlipidemia    Hypertension    IBS (irritable bowel syndrome)    Moderate aortic valve insufficiency    Myocardial infarction (HCC) 07/2000   Paroxysmal atrial fibrillation (HCC)    Pneumonia    Pulmonary nodule    Type 2 diabetes mellitus with stage 3b chronic kidney disease, without long-term current use of insulin [facility])     Surgical History: Past Surgical History:  Procedure Laterality Date   CARDIAC CATHETERIZATION     08/08, 02/28/12, 03/29/12(2 stents)   CATARACT EXTRACTION W/PHACO Left 03/28/2017   Procedure: CATARACT EXTRACTION PHACO AND INTRAOCULAR LENS PLACEMENT (IOC) LEFT;  Surgeon: Lockie Mola, MD;  Location: [facility];  Service: Ophthalmology;  Laterality: Left;  IVA BLOCK LEFT   CATARACT EXTRACTION W/PHACO Right 04/18/2017   Procedure: CATARACT EXTRACTION PHACO AND INTRAOCULAR LENS PLACEMENT (IOC) Right IVA BLOCK;  Surgeon: Lockie Mola, MD;  Location: [facility];  Service: Ophthalmology;  Laterality: Right;  IVA BLOCK   COLONOSCOPY  08/03/2005   CORONARY ARTERY BYPASS GRAFT  07/23/2000   1 vessel   CYSTOSCOPY W/ URETERAL STENT REMOVAL Left 06/09/2022   Procedure: CYSTOSCOPY WITH STENT REMOVAL;  Surgeon: Sondra Come, MD;  Location: [facility];  Service: Urology;  Laterality: Left;   CYSTOSCOPY/URETEROSCOPY/HOLMIUM LASER/STENT PLACEMENT Left 05/15/2022    Procedure: CYSTOSCOPY/URETEROSCOPY/HOLMIUM LASER/STENT PLACEMENT;  Surgeon: Sondra Come, MD;  Location: [facility];  Service: Urology;  Laterality: Left;   CYSTOSCOPY/URETEROSCOPY/HOLMIUM LASER/STENT PLACEMENT Right 06/09/2022   Procedure: CYSTOSCOPY/URETEROSCOPY/HOLMIUM LASER/STENT PLACEMENT;  Surgeon: Sondra Come, MD;  Location: [facility];  Service: Urology;  Laterality: Right;   ESOPHAGOGASTRODUODENOSCOPY  01/08/2006   EXPLORATORY LAPAROTOMY  1983    Home Medications:  Allergies as of 10/25/2023   No Known Allergies      Medication List        Accurate as of October 25, 2023  2:16 PM. If you have any questions, ask your nurse or doctor.          acetaminophen 325 MG tablet Commonly known as: TYLENOL Take by mouth.   albuterol 108 (90 Base) MCG/ACT inhaler Commonly known as: VENTOLIN HFA Inhale 2 puffs into the lungs every 4 (four) hours as needed for wheezing or shortness of breath.   aspirin EC 81 MG tablet Take 81 mg by mouth daily.   ciprofloxacin-dexamethasone OTIC suspension Commonly known as: CIPRODEX   citalopram 40 MG tablet Commonly known as: CELEXA Take 40 mg by mouth daily.   isosorbide dinitrate 30 MG tablet Commonly known as: ISORDIL Take 30 mg by mouth daily.   Jardiance 10 MG Tabs tablet Generic drug: empagliflozin Take 10 mg by mouth every morning.   losartan 100 MG tablet Commonly known as: COZAAR Take 100 mg by mouth daily.   metoprolol tartrate 25 MG tablet Commonly known as: LOPRESSOR Take 12.5 mg by mouth 2 (two) times daily.  omeprazole 20 MG capsule Commonly known as: PRILOSEC Take 20 mg by mouth daily.   ondansetron 4 MG disintegrating tablet Commonly known as: ZOFRAN-ODT Take 1 tablet (4 mg total) by mouth every 8 (eight) hours as needed for nausea or vomiting.   simvastatin 40 MG tablet Commonly known as: ZOCOR Take 40 mg by mouth daily.   tamsulosin 0.4 MG Caps capsule Commonly known as: FLOMAX Take 1 capsule  (0.4 mg total) by mouth at bedtime.   triamcinolone ointment 0.1 % Commonly known as: KENALOG Apply topically 3 (three) times daily.   valACYclovir 500 MG tablet Commonly known as: VALTREX twice a day 10 days with glass of water. Start 2 days before cryotherapy appointment for lip.   venlafaxine XR 37.5 MG 24 hr capsule Commonly known as: EFFEXOR-XR Take 37.5 mg by mouth daily.        Allergies:  No Known Allergies  Family History: No family history on file.  Social History:   reports that he has never smoked. He has never been exposed to tobacco smoke. He has never used smokeless tobacco. He reports that he does not drink alcohol and does not use drugs.  Physical Exam: BP (!) 84/79   Pulse 80   SpO2 95%   Constitutional:  Alert and oriented, no acute distress, nontoxic appearing HEENT: [place], AT Cardiovascular: No clubbing, cyanosis, or edema Respiratory: Normal respiratory effort, no increased work of breathing Skin: No rashes, bruises or suspicious lesions Neurologic: Grossly intact, no focal deficits, moving all 4 extremities Psychiatric: Normal mood and affect  Pertinent Imaging: KUB, 10/25/2023: See Epic  I personally reviewed the images referenced above and note no radiopaque urolithiasis and stable numerous phleboliths.  Assessment & Plan:   1. Nephrolithiasis (Primary) No radiopaque stones on KUB today. No acute renal colic. I offered him RUS vs CT stone study for further evaluation of his chronic, b/l flank pain, however he prefers to defer this, which is reasonable. We discussed return precautions including dysuria, fevers, gross hematuria, and acute worsening in flank pain.  Return in about 1 year (around 10/24/2024) for Annual stone visit with KUB prior.  Carman Ching, PA-C  [facility] [place] [address], [address] [place], [place] [address] [phone]

## 2023-12-22 IMAGING — CT CT CHEST W/O CM
2 of 4 series · 15 of 36 positions shown, 18 images · non-contrast
Comparison: CT done on 05/15/2012, chest radiograph done on
11/26/2021

CLINICAL DATA: Difficulty breathing



[Series 2: thorax · axial · 0.81mm/px · z∈[-530,-254]mm · 12 of 164 slices shown, 15 images]
[im 13/164  mediastinal]
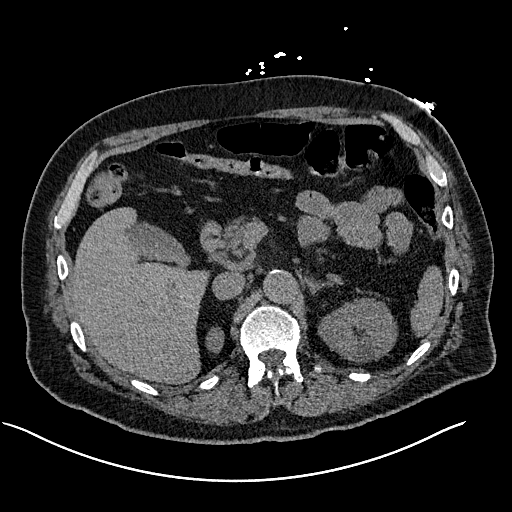
[im 13/164  lung]
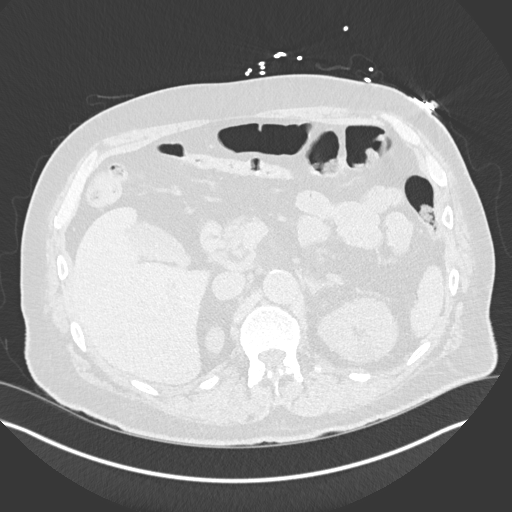
[im 26/164  lung]
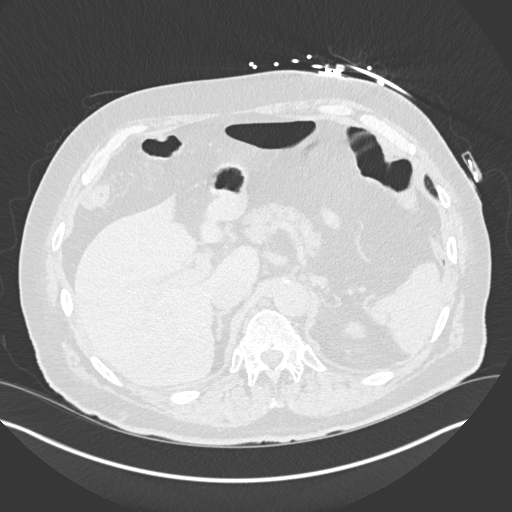
[im 38/164  lung]
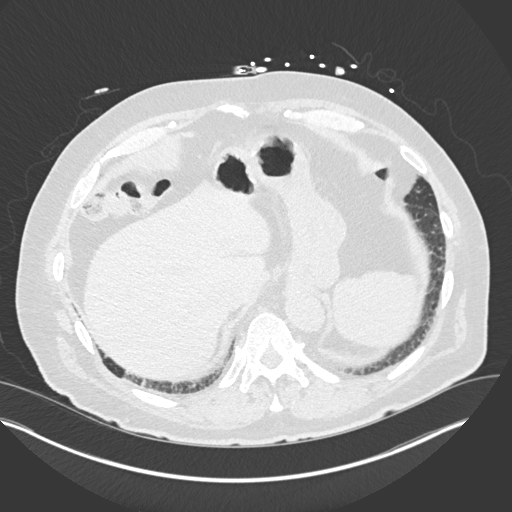
[im 51/164  lung]
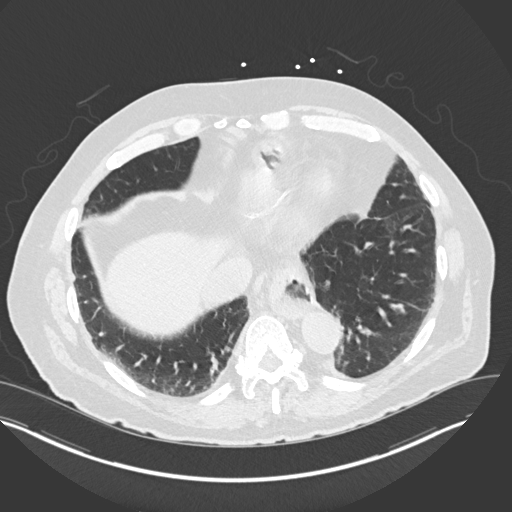
[im 63/164  mediastinal]
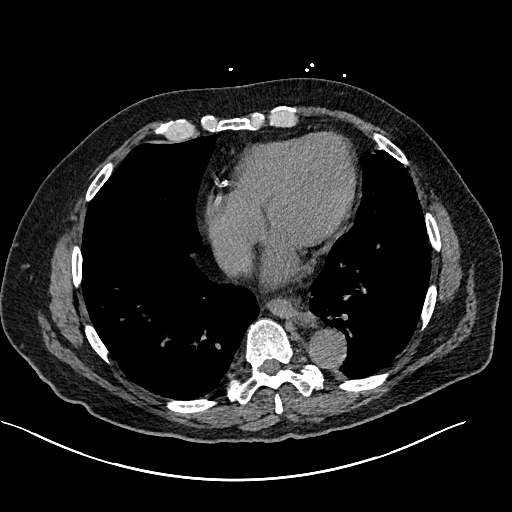
[im 63/164  lung]
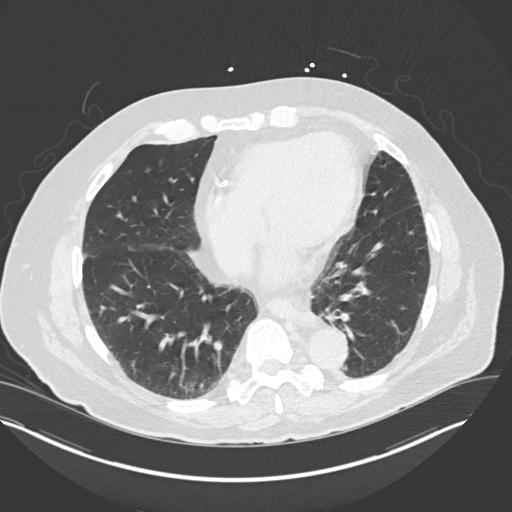
[im 76/164  lung]
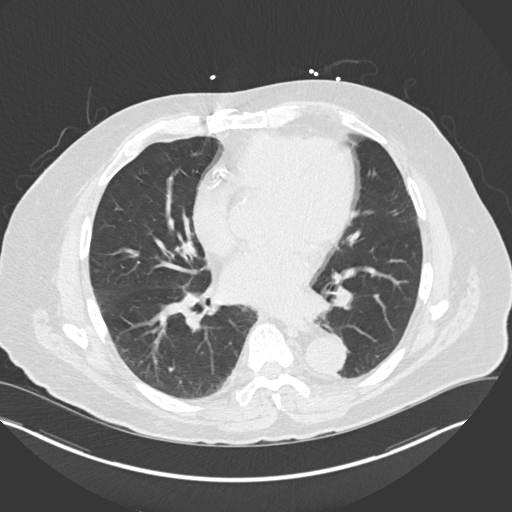
[im 88/164  lung]
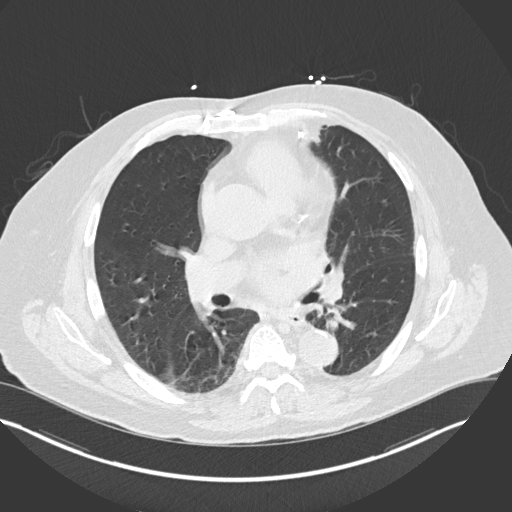
[im 101/164  lung]
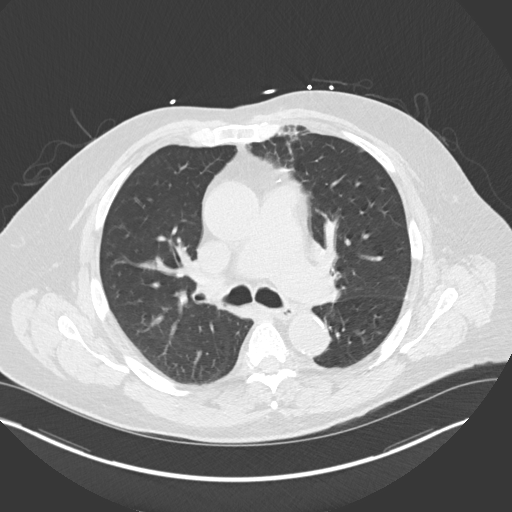
[im 113/164  mediastinal]
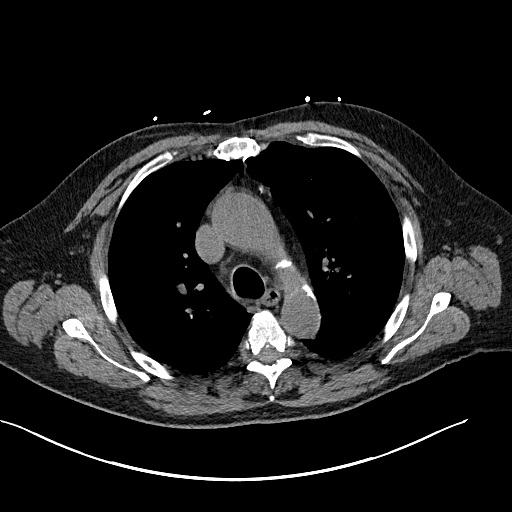
[im 113/164  lung]
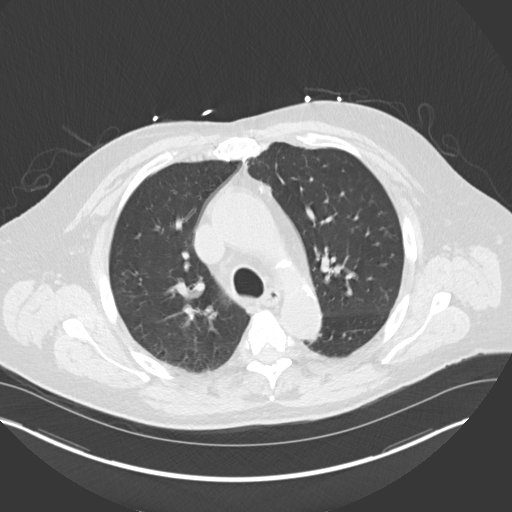
[im 126/164  lung]
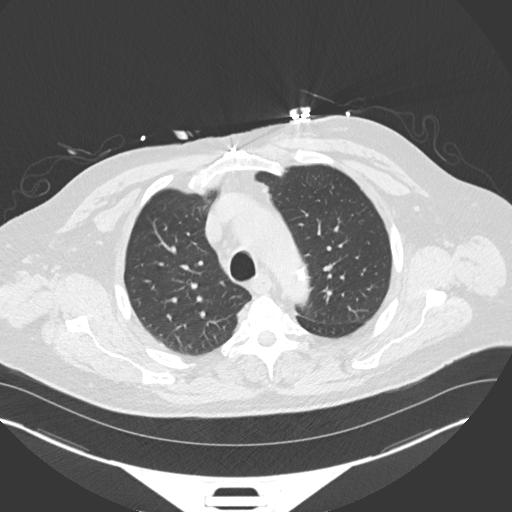
[im 138/164  lung]
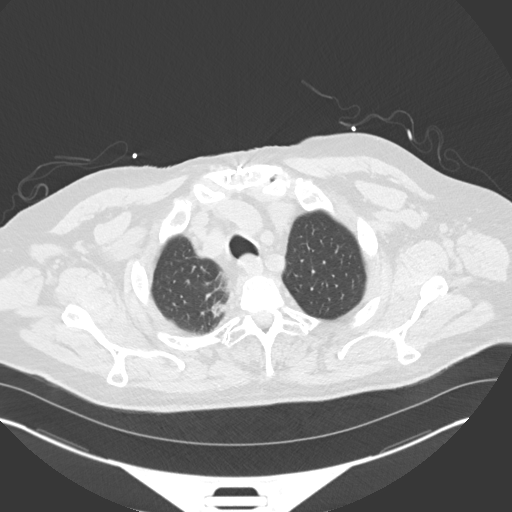
[im 151/164  lung]
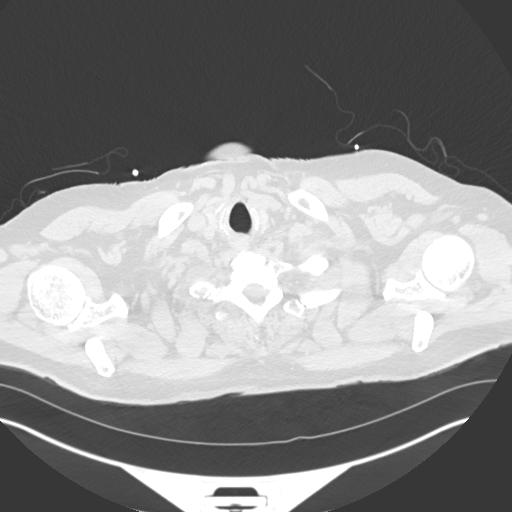

[Series 5: coronal · coronal · 0.67mm/px · 3 of 152 slices shown]
[im 31/152  lung]
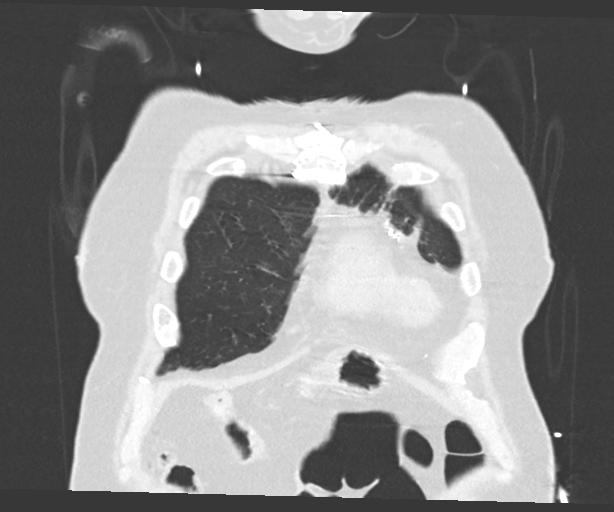
[im 61/152  lung]
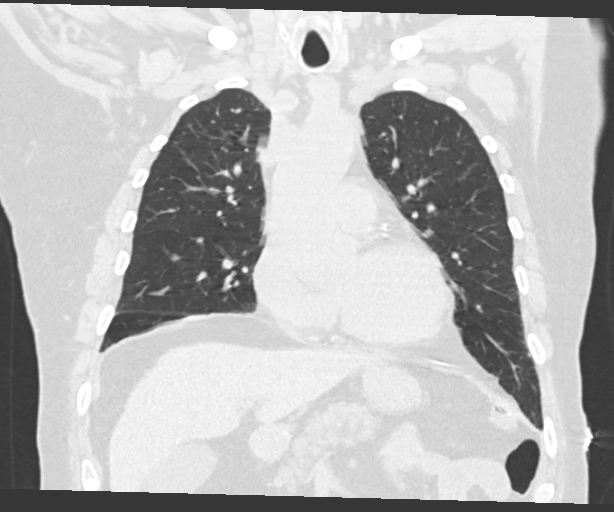
[im 91/152  lung]
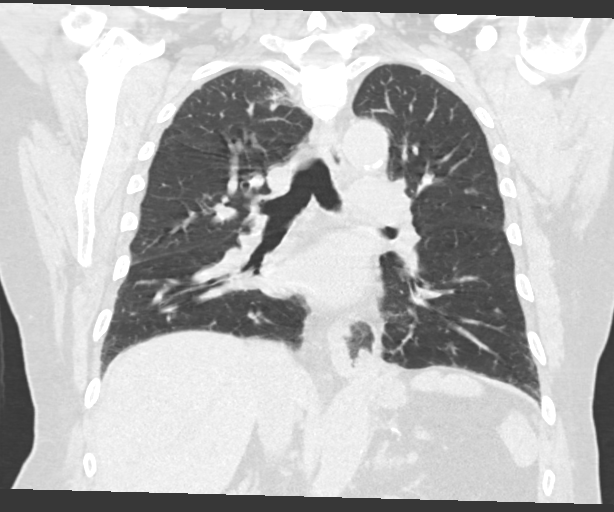

[15 of 36 positions shown; findings below may reference images not displayed]

FINDINGS: Cardiovascular: Coronary artery calcifications are seen. There is
ectasia of ascending thoracic aorta measuring 4.3 cm. There is
ectasia of main pulmonary artery measuring 4.1 cm.

Mediastinum/Nodes: Subcentimeter nodes are noted in the mediastinum.

Lungs/Pleura: There is no focal pulmonary consolidation. Increased
interstitial markings in the anteromedial left mid lung fields has
not changed significantly. There is new small patchy ground-glass
infiltrate in the medial right apex. No new discrete lung nodules
are seen. There is prominence of interstitial markings in the
posterior aspect of both lower lung fields. No new discrete lung
nodules are seen.

Upper Abdomen: Small hiatal hernia is seen. Right kidney is smaller
than left. There is 5 mm calculus in the visualized upper pole of
right kidney. There is possible cyst in the posterior aspect of
upper pole of left kidney.

Musculoskeletal: Metallic sutures seen in the sternum.
IMPRESSION: New small foci of ground-glass densities in the medial right apex
suggesting scarring or interstitial pneumonia. There is interval
increase in interstitial markings in the posterior lower lung
fields, possibly suggesting scarring. There is no focal pulmonary
consolidation. No significant lymphadenopathy seen in mediastinum.

Coronary artery calcifications are seen. There is ectasia of main
pulmonary artery suggesting pulmonary arterial hypertension. Small
hiatal hernia. Right renal stone. Possible left renal cyst.

Other findings as described in the body of the report.

## 2023-12-22 IMAGING — CR DG CHEST 2V
2 series · 2 of 2 positions shown · non-contrast
Comparison: 02/12/2015

CLINICAL DATA: Worsening shortness of breath

EXAM:
CHEST - 2 VIEW

[chest lat]
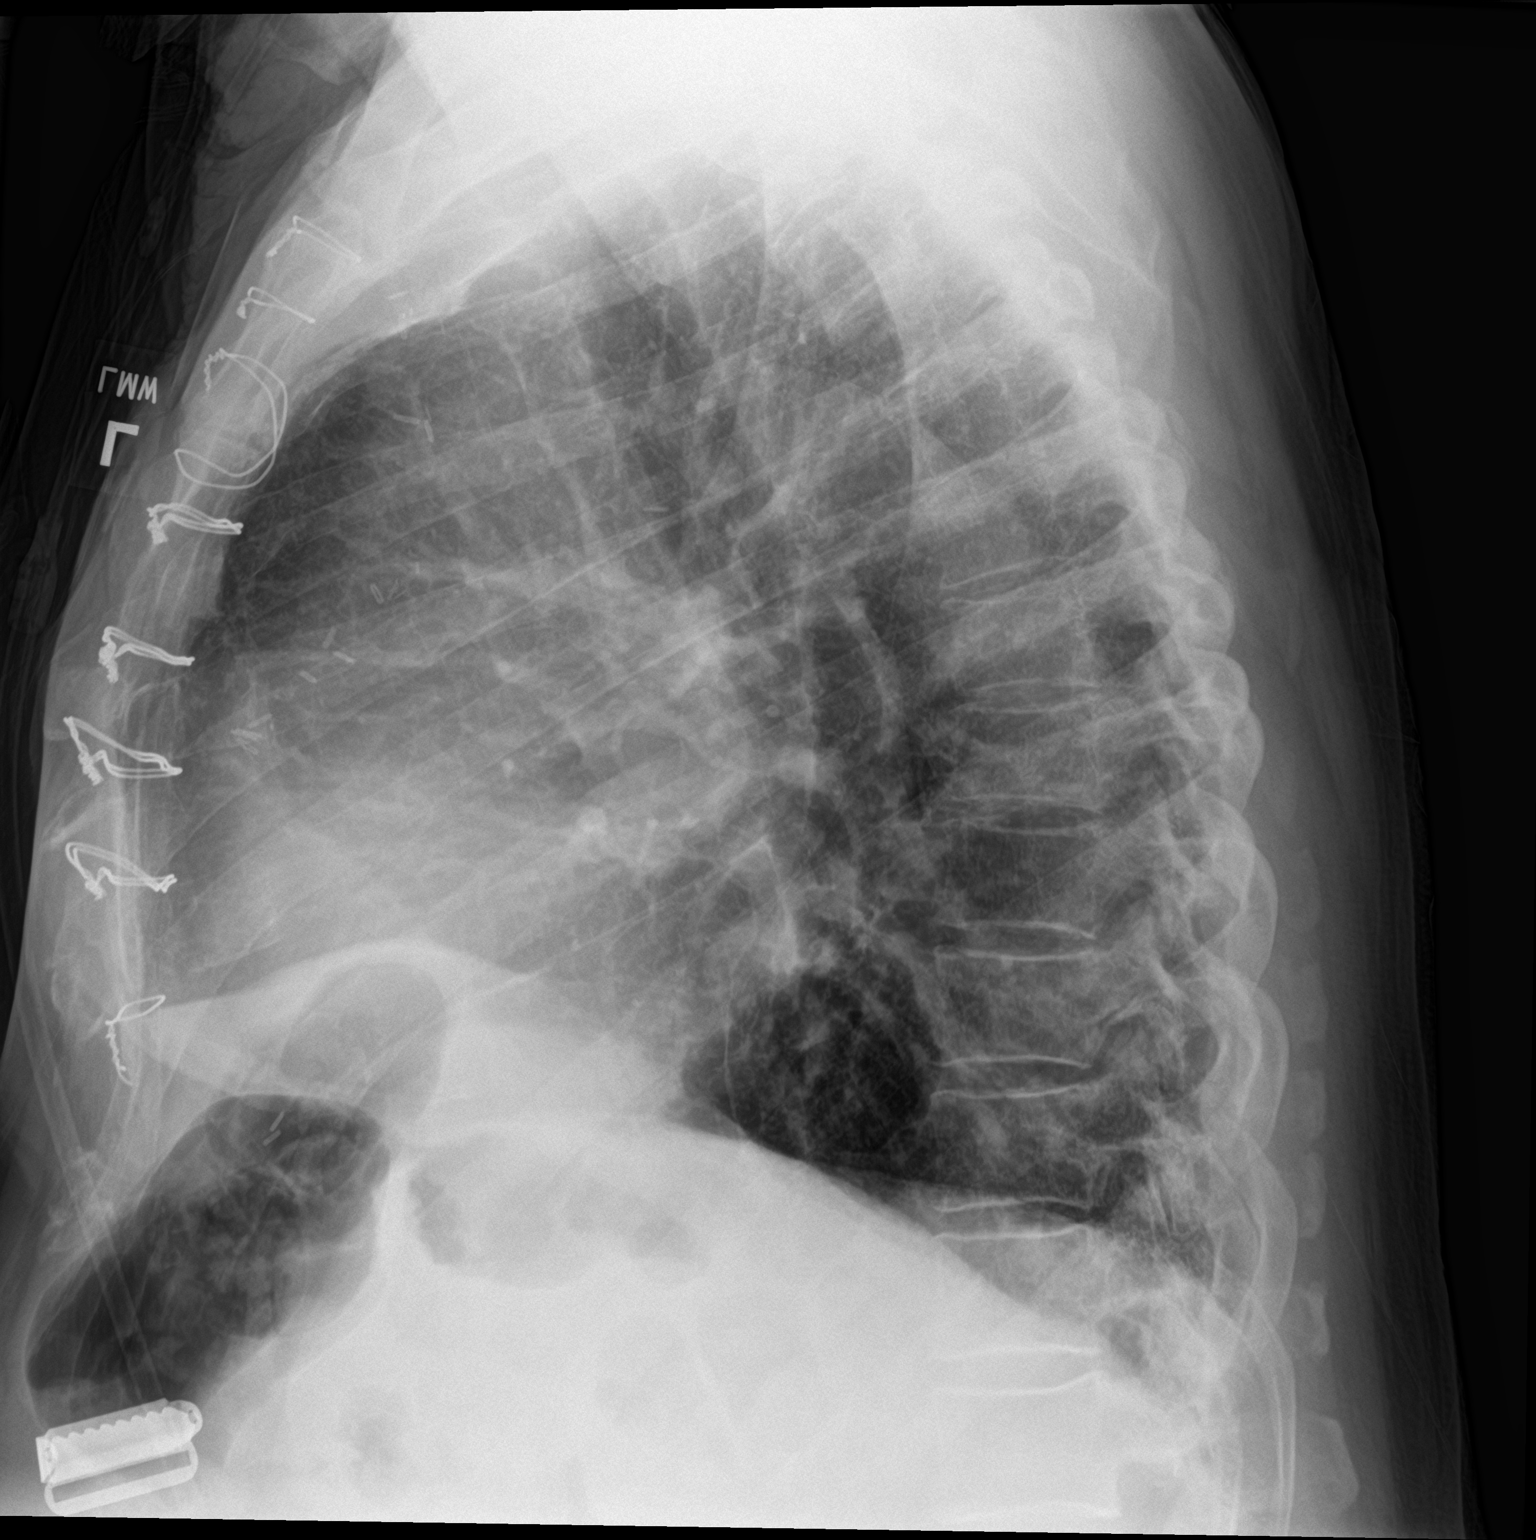

[chest ap]
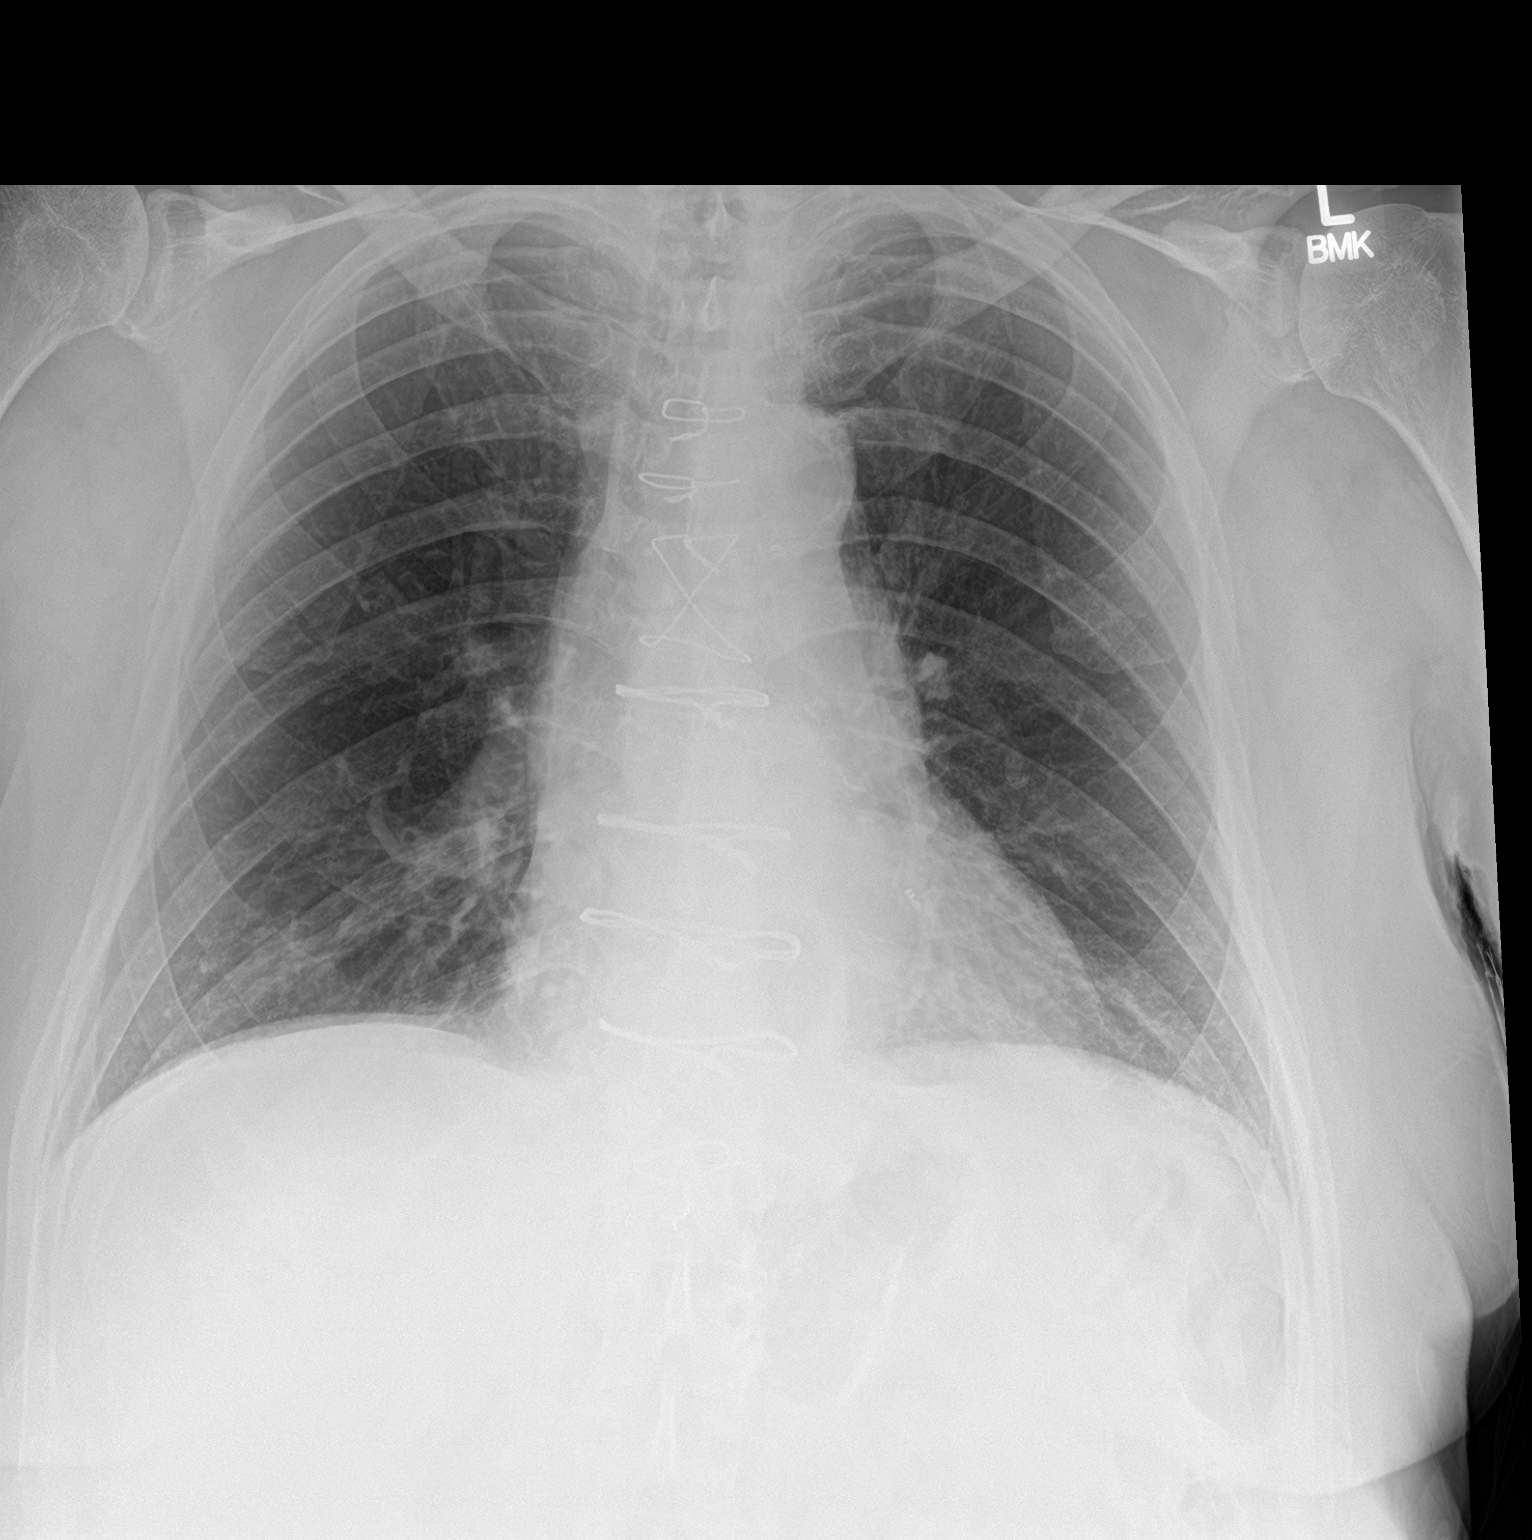

[2 of 2 positions shown; findings below may reference images not displayed]

FINDINGS: Cardiac shadow is stable. Postsurgical changes are again seen. Lungs
are clear bilaterally. No bony abnormality is noted.
IMPRESSION: No active cardiopulmonary disease.

## 2024-03-06 ENCOUNTER — Ambulatory Visit: Payer: Medicare HMO | Admitting: Dermatology

## 2024-03-24 ENCOUNTER — Other Ambulatory Visit: Payer: Self-pay | Admitting: Nephrology

## 2024-03-24 DIAGNOSIS — R809 Proteinuria, unspecified: Secondary | ICD-10-CM

## 2024-03-24 DIAGNOSIS — N1831 Chronic kidney disease, stage 3a: Secondary | ICD-10-CM

## 2024-03-24 DIAGNOSIS — K8689 Other specified diseases of pancreas: Secondary | ICD-10-CM

## 2024-03-27 ENCOUNTER — Ambulatory Visit
Admission: RE | Admit: 2024-03-27 | Discharge: 2024-03-27 | Disposition: A | Discharge status: Home / Self Care | Source: Ambulatory Visit | Attending: Nephrology | Admitting: Nephrology

## 2024-03-27 DIAGNOSIS — K8689 Other specified diseases of pancreas: Secondary | ICD-10-CM | POA: Diagnosis present

## 2024-03-27 DIAGNOSIS — R809 Proteinuria, unspecified: Secondary | ICD-10-CM | POA: Diagnosis present

## 2024-03-27 DIAGNOSIS — N1831 Chronic kidney disease, stage 3a: Secondary | ICD-10-CM | POA: Diagnosis present

## 2024-03-27 MED ORDER — GADOBUTROL 1 MMOL/ML IV SOLN
9.0000 mL | Freq: Once | INTRAVENOUS | Status: AC | PRN
Start: 2024-03-27 — End: 2024-03-27
  Administered 2024-03-27: 9 mL via INTRAVENOUS

## 2024-05-13 ENCOUNTER — Ambulatory Visit (INDEPENDENT_AMBULATORY_CARE_PROVIDER_SITE_OTHER): Admitting: Physician Assistant

## 2024-05-13 VITALS — BP 127/75 | HR 65 | Ht 69.0 in | Wt 219.0 lb

## 2024-05-13 DIAGNOSIS — N4889 Other specified disorders of penis: Secondary | ICD-10-CM | POA: Diagnosis not present

## 2024-05-13 LAB — URINALYSIS, COMPLETE
Bilirubin, UA: NEGATIVE
Ketones, UA: NEGATIVE
Leukocytes,UA: NEGATIVE
Nitrite, UA: NEGATIVE
RBC, UA: NEGATIVE
Specific Gravity, UA: 1.02 (ref 1.005–1.030)
Urobilinogen, Ur: 0.2 mg/dL (ref 0.2–1.0)
pH, UA: 6 (ref 5.0–7.5)

## 2024-05-13 LAB — MICROSCOPIC EXAMINATION: Bacteria, UA: NONE SEEN

## 2024-05-13 NOTE — Progress Notes (Signed)
 05/13/2024 1:48 PM   Norleen JAYSON Blankenship 04-Sep-1941 [id number]  CC: Chief Complaint  Patient presents with   Nephrolithiasis   HPI: Jonathan Blankenship is a 83 y.o. male with PMH atrophic right kidney, BPH, DM 2 on Jardiance, and nephrolithiasis who presents today for evaluation of persistent UTI symptoms.  He is accompanied today by his niece, [name], who contributes to HPI.  Today he reports a couple months of intermittent burning/stinging at the urethral meatus.  He has had multiple courses of antibiotics and p.o. Diflucan , but none of these have helped.  Notably, he started Jardiance earlier this year prior to onset of symptoms.  In-office UA today positive for 1+ glucose and trace protein; urine microscopy pan negative. PVR 31mL.  PMH: Past Medical History:  Diagnosis Date   BPH (benign prostatic hyperplasia)    Coronary artery disease    Depression    GERD (gastroesophageal reflux disease)    History of kidney stones    Hyperlipidemia    Hypertension    IBS (irritable bowel syndrome)    Moderate aortic valve insufficiency    Myocardial infarction (HCC) 07/2000   Paroxysmal atrial fibrillation (HCC)    Pneumonia    Pulmonary nodule    Type 2 diabetes mellitus with stage 3b chronic kidney disease, without long-term current use of insulin [facility])     Surgical History: Past Surgical History:  Procedure Laterality Date   CARDIAC CATHETERIZATION     08/08, 02/28/12, 03/29/12(2 stents)   CATARACT EXTRACTION W/PHACO Left 03/28/2017   Procedure: CATARACT EXTRACTION PHACO AND INTRAOCULAR LENS PLACEMENT (IOC) LEFT;  Surgeon: Mittie Gaskin, MD;  Location: [facility];  Service: Ophthalmology;  Laterality: Left;  IVA BLOCK LEFT   CATARACT EXTRACTION W/PHACO Right 04/18/2017   Procedure: CATARACT EXTRACTION PHACO AND INTRAOCULAR LENS PLACEMENT (IOC) Right IVA BLOCK;  Surgeon: Mittie Gaskin, MD;  Location: [facility];  Service: Ophthalmology;  Laterality:  Right;  IVA BLOCK   COLONOSCOPY  08/03/2005   CORONARY ARTERY BYPASS GRAFT  07/23/2000   1 vessel   CYSTOSCOPY W/ URETERAL STENT REMOVAL Left 06/09/2022   Procedure: CYSTOSCOPY WITH STENT REMOVAL;  Surgeon: Francisca Redell JAYSON, MD;  Location: [facility];  Service: Urology;  Laterality: Left;   CYSTOSCOPY/URETEROSCOPY/HOLMIUM LASER/STENT PLACEMENT Left 05/15/2022   Procedure: CYSTOSCOPY/URETEROSCOPY/HOLMIUM LASER/STENT PLACEMENT;  Surgeon: Francisca Redell JAYSON, MD;  Location: [facility];  Service: Urology;  Laterality: Left;   CYSTOSCOPY/URETEROSCOPY/HOLMIUM LASER/STENT PLACEMENT Right 06/09/2022   Procedure: CYSTOSCOPY/URETEROSCOPY/HOLMIUM LASER/STENT PLACEMENT;  Surgeon: Francisca Redell JAYSON, MD;  Location: [facility];  Service: Urology;  Laterality: Right;   ESOPHAGOGASTRODUODENOSCOPY  01/08/2006   EXPLORATORY LAPAROTOMY  1983    Home Medications:  Allergies as of 05/13/2024   No Known Allergies      Medication List        Accurate as of May 13, 2024  1:48 PM. If you have any questions, ask your nurse or doctor.          acetaminophen  325 MG tablet Commonly known as: TYLENOL  Take by mouth.   albuterol  108 (90 Base) MCG/ACT inhaler Commonly known as: VENTOLIN  HFA Inhale 2 puffs into the lungs every 4 (four) hours as needed for wheezing or shortness of breath.   aspirin  EC 81 MG tablet Take 81 mg by mouth daily.   ciprofloxacin-dexamethasone  OTIC suspension Commonly known as: CIPRODEX   citalopram  40 MG tablet Commonly known as: CELEXA  Take 40 mg by mouth daily.   isosorbide  dinitrate 30 MG tablet Commonly known as:  ISORDIL  Take 30 mg by mouth daily.   Jardiance 10 MG Tabs tablet Generic drug: empagliflozin Take 10 mg by mouth every morning.   losartan 100 MG tablet Commonly known as: COZAAR Take 100 mg by mouth daily.   metoprolol  tartrate 25 MG tablet Commonly known as: LOPRESSOR  Take 12.5 mg by mouth 2 (two) times daily.   omeprazole 20 MG capsule Commonly known as:  PRILOSEC Take 20 mg by mouth daily.   ondansetron  4 MG disintegrating tablet Commonly known as: ZOFRAN -ODT Take 1 tablet (4 mg total) by mouth every 8 (eight) hours as needed for nausea or vomiting.   simvastatin  40 MG tablet Commonly known as: ZOCOR  Take 40 mg by mouth daily.   tamsulosin  0.4 MG Caps capsule Commonly known as: FLOMAX  Take 1 capsule (0.4 mg total) by mouth at bedtime.   triamcinolone ointment 0.1 % Commonly known as: KENALOG Apply topically 3 (three) times daily.   valACYclovir  500 MG tablet Commonly known as: VALTREX  twice a day 10 days with glass of water . Start 2 days before cryotherapy appointment for lip.   venlafaxine  XR 37.5 MG 24 hr capsule Commonly known as: EFFEXOR -XR Take 37.5 mg by mouth daily.        Allergies:  No Known Allergies  Family History: No family history on file.  Social History:   reports that he has never smoked. He has never been exposed to tobacco smoke. He has never used smokeless tobacco. He reports that he does not drink alcohol and does not use drugs.  Physical Exam: BP 127/75   Pulse 65   Ht 5' 9 (1.753 m)   Wt 219 lb (99.3 kg)   BMI 32.34 kg/m   Constitutional:  Alert and oriented, no acute distress, nontoxic appearing HEENT: [place], AT Cardiovascular: No clubbing, cyanosis, or edema Respiratory: Normal respiratory effort, no increased work of breathing GU: Circumcised penis.  There is an almost vesicular appearing, red-purple skin lesion at the left meatus, see photo in media tab. Skin: No rashes, bruises or suspicious lesions Neurologic: Grossly intact, no focal deficits, moving all 4 extremities Psychiatric: Normal mood and affect  Laboratory Data: Results for orders placed or performed in visit on 05/13/24  Microscopic Examination   Collection Time: 05/13/24  1:39 PM   Urine  Result Value Ref Range   WBC, UA 0-5 0 - 5 /hpf   RBC, Urine 0-2 0 - 2 /hpf   Epithelial Cells (non renal) 0-10 0 - 10 /hpf    Bacteria, UA None seen None seen/Few  Urinalysis, Complete   Collection Time: 05/13/24  1:39 PM  Result Value Ref Range   Specific Gravity, UA 1.020 1.005 - 1.030   pH, UA 6.0 5.0 - 7.5   Color, UA Yellow Yellow   Appearance Ur Clear Clear   Leukocytes,UA Negative Negative   Protein,UA Trace Negative/Trace   Glucose, UA 1+ (A) Negative   Ketones, UA Negative Negative   RBC, UA Negative Negative   Bilirubin, UA Negative Negative   Urobilinogen, Ur 0.2 0.2 - 1.0 mg/dL   Nitrite, UA Negative Negative   Microscopic Examination Comment    Microscopic Examination See below:    Assessment & Plan:   1. Penile irritation (Primary) UA bland. Differential includes dermatologic lesion of the urethral meatus versus Jardiance side effect. Shingles less likely given duration. I took a photo of his lesion and added it to his media tab today. I encouraged him to follow up with his dermatologist for consideration of  biopsy, alternatively could see his PCP to discuss Jardiance alternatives. He expressed understanding. - Urinalysis, Complete - BLADDER SCAN AMB NON-IMAGING   Return if symptoms worsen or fail to improve.  Lucie Hones, PA-C  [facility] [place] [address] [place], [place] [address] [phone]

## 2024-05-13 NOTE — Patient Instructions (Signed)
 I think your penile irritation is either a side effect of your Jardiance or has something to do with the skin lesion I saw on your penis today. I took a Building services engineer of this and put it in your chart. I recommend that you see dermatology or your primary care to discuss the skin lesion or alternative medications to Jardiance.

## 2024-06-18 ENCOUNTER — Ambulatory Visit: Admitting: Physician Assistant

## 2024-06-19 ENCOUNTER — Encounter: Payer: Self-pay | Admitting: Dermatology

## 2024-06-19 ENCOUNTER — Telehealth: Payer: Self-pay

## 2024-06-19 ENCOUNTER — Ambulatory Visit (INDEPENDENT_AMBULATORY_CARE_PROVIDER_SITE_OTHER): Admitting: Dermatology

## 2024-06-19 DIAGNOSIS — I781 Nevus, non-neoplastic: Secondary | ICD-10-CM | POA: Diagnosis not present

## 2024-06-19 NOTE — Progress Notes (Signed)
   Follow-Up Visit   Subjective  Jonathan Blankenship is a 83 y.o. male who presents for the following: Spot of concern on head of penis; has seen urologist and they suggested he be seen here. Is painful.    The following portions of the chart were reviewed this encounter and updated as appropriate: medications, allergies, medical history  Review of Systems:  No other skin or systemic complaints except as noted in HPI or Assessment and Plan.  Objective  Well appearing patient in no apparent distress; mood and affect are within normal limits.   A focused examination was performed of the following areas: Penis  Relevant exam findings are noted in the Assessment and Plan.    Assessment & Plan   Venulectasia of glans penis  Exam: violaceous vascular connected papules coalescing on left glans penis and urethral opening  Plan: Benign dilation of venules. Less likely lymphatic malformation Chronic Patient desires treatment given burning discomfort Dr Hester agrees with diagnosis upon review of photo from urology Dr Hester can treat with laser. It won't be covered by insurance. Will cost $350 per session and may require 1 to 3 sessions. Dr Hester would apply topical lidocaine  to reduce discomfort, but treatment may be painful. Can expect skin irritation, crusting, rarely a blister.    TELANGIECTASIA    Return if symptoms worsen or fail to improve.  I, Jonathan Blankenship, CMA am acting as scribe for Jonathan Sharps, MD.   Documentation: I have reviewed the above documentation for accuracy and completeness, and I agree with the above.  Jonathan Sharps, MD

## 2024-06-19 NOTE — Telephone Encounter (Signed)
 Per Dr. Vernon request patient niece [name] Molt was informed that  Dr Hester can treat the area of concern that patient was in clinic for  with laser. It won't be covered by insurance. Will cost $350 per session and may require 1 to 3 sessions. Dr Hester would apply topical lidocaine  to reduce discomfort, but treatment may be painful. Can expect skin irritation, crusting, rarely a blister. [name] voiced good understanding and will discuss treatment with patient. She is informed to call back with questions or if they would like to schedule an appointment for treatment. She voiced good understanding and denied further questions.

## 2024-06-19 NOTE — Patient Instructions (Addendum)

## 2024-07-17 ENCOUNTER — Ambulatory Visit (INDEPENDENT_AMBULATORY_CARE_PROVIDER_SITE_OTHER): Admitting: Physician Assistant

## 2024-07-17 VITALS — BP 110/64 | HR 69 | Ht 69.0 in | Wt 220.0 lb

## 2024-07-17 DIAGNOSIS — Z87442 Personal history of urinary calculi: Secondary | ICD-10-CM | POA: Diagnosis not present

## 2024-07-17 DIAGNOSIS — N200 Calculus of kidney: Secondary | ICD-10-CM

## 2024-07-17 DIAGNOSIS — R109 Unspecified abdominal pain: Secondary | ICD-10-CM | POA: Diagnosis not present

## 2024-07-17 DIAGNOSIS — N401 Enlarged prostate with lower urinary tract symptoms: Secondary | ICD-10-CM

## 2024-07-17 DIAGNOSIS — R3914 Feeling of incomplete bladder emptying: Secondary | ICD-10-CM

## 2024-07-17 LAB — BLADDER SCAN AMB NON-IMAGING

## 2024-07-17 LAB — URINALYSIS, COMPLETE
Bilirubin, UA: NEGATIVE
Glucose, UA: NEGATIVE
Ketones, UA: NEGATIVE
Leukocytes,UA: NEGATIVE
Nitrite, UA: NEGATIVE
RBC, UA: NEGATIVE
Specific Gravity, UA: 1.025 (ref 1.005–1.030)
Urobilinogen, Ur: 0.2 mg/dL (ref 0.2–1.0)
pH, UA: 6 (ref 5.0–7.5)

## 2024-07-17 LAB — MICROSCOPIC EXAMINATION

## 2024-07-17 NOTE — Progress Notes (Signed)
 07/17/2024 2:10 PM   Jonathan Blankenship 1941-06-22 [id number]  CC: Chief Complaint  Patient presents with   Establish Care   Nephrolithiasis   HPI: Jonathan Blankenship is a 83 y.o. male with PMH atrophic right kidney, BPH on Flomax , DM2 on Jardiance, and nephrolithiasis who presents today for evaluation of a possible acute stone episode.   Today he reports 2 days of bilateral low back and RLQ pain as well as dysuria.  The pain is worse when he is lying down at night.  No history of back pain.  He remains on Flomax  0.4 mg daily and has some mild orthostasis on this.  He denies fever, chills, nausea, or vomiting.  In-office UA today positive for 1+ protein; urine microscopy pan negative.  PVR 244 mL.  PMH: Past Medical History:  Diagnosis Date   BPH (benign prostatic hyperplasia)    Coronary artery disease    Depression    GERD (gastroesophageal reflux disease)    History of kidney stones    Hyperlipidemia    Hypertension    IBS (irritable bowel syndrome)    Moderate aortic valve insufficiency    Myocardial infarction (HCC) 07/2000   Paroxysmal atrial fibrillation (HCC)    Pneumonia    Pulmonary nodule    Type 2 diabetes mellitus with stage 3b chronic kidney disease, without long-term current use of insulin [facility])     Surgical History: Past Surgical History:  Procedure Laterality Date   CARDIAC CATHETERIZATION     08/08, 02/28/12, 03/29/12(2 stents)   CATARACT EXTRACTION W/PHACO Left 03/28/2017   Procedure: CATARACT EXTRACTION PHACO AND INTRAOCULAR LENS PLACEMENT (IOC) LEFT;  Surgeon: Mittie Gaskin, MD;  Location: [facility];  Service: Ophthalmology;  Laterality: Left;  IVA BLOCK LEFT   CATARACT EXTRACTION W/PHACO Right 04/18/2017   Procedure: CATARACT EXTRACTION PHACO AND INTRAOCULAR LENS PLACEMENT (IOC) Right IVA BLOCK;  Surgeon: Mittie Gaskin, MD;  Location: [facility];  Service: Ophthalmology;  Laterality: Right;  IVA BLOCK   COLONOSCOPY   08/03/2005   CORONARY ARTERY BYPASS GRAFT  07/23/2000   1 vessel   CYSTOSCOPY W/ URETERAL STENT REMOVAL Left 06/09/2022   Procedure: CYSTOSCOPY WITH STENT REMOVAL;  Surgeon: Francisca Redell JAYSON, MD;  Location: [facility];  Service: Urology;  Laterality: Left;   CYSTOSCOPY/URETEROSCOPY/HOLMIUM LASER/STENT PLACEMENT Left 05/15/2022   Procedure: CYSTOSCOPY/URETEROSCOPY/HOLMIUM LASER/STENT PLACEMENT;  Surgeon: Francisca Redell JAYSON, MD;  Location: [facility];  Service: Urology;  Laterality: Left;   CYSTOSCOPY/URETEROSCOPY/HOLMIUM LASER/STENT PLACEMENT Right 06/09/2022   Procedure: CYSTOSCOPY/URETEROSCOPY/HOLMIUM LASER/STENT PLACEMENT;  Surgeon: Francisca Redell JAYSON, MD;  Location: [facility];  Service: Urology;  Laterality: Right;   ESOPHAGOGASTRODUODENOSCOPY  01/08/2006   EXPLORATORY LAPAROTOMY  1983    Home Medications:  Allergies as of 07/17/2024   No Known Allergies      Medication List        Accurate as of July 17, 2024  2:10 PM. If you have any questions, ask your nurse or doctor.          acetaminophen  325 MG tablet Commonly known as: TYLENOL  Take by mouth.   albuterol  108 (90 Base) MCG/ACT inhaler Commonly known as: VENTOLIN  HFA Inhale 2 puffs into the lungs every 4 (four) hours as needed for wheezing or shortness of breath.   aspirin  EC 81 MG tablet Take 81 mg by mouth daily.   ciprofloxacin-dexamethasone  OTIC suspension Commonly known as: CIPRODEX   citalopram  40 MG tablet Commonly known as: CELEXA  Take 40 mg by mouth daily.  isosorbide  dinitrate 30 MG tablet Commonly known as: ISORDIL  Take 30 mg by mouth daily.   Jardiance 10 MG Tabs tablet Generic drug: empagliflozin Take 10 mg by mouth every morning.   losartan 100 MG tablet Commonly known as: COZAAR Take 100 mg by mouth daily.   metoprolol  tartrate 25 MG tablet Commonly known as: LOPRESSOR  Take 12.5 mg by mouth 2 (two) times daily.   omeprazole 20 MG capsule Commonly known as: PRILOSEC Take 20 mg by mouth  daily.   ondansetron  4 MG disintegrating tablet Commonly known as: ZOFRAN -ODT Take 1 tablet (4 mg total) by mouth every 8 (eight) hours as needed for nausea or vomiting.   simvastatin  40 MG tablet Commonly known as: ZOCOR  Take 40 mg by mouth daily.   tamsulosin  0.4 MG Caps capsule Commonly known as: FLOMAX  Take 1 capsule (0.4 mg total) by mouth at bedtime.   triamcinolone ointment 0.1 % Commonly known as: KENALOG Apply topically 3 (three) times daily.   valACYclovir  500 MG tablet Commonly known as: VALTREX  twice a day 10 days with glass of water . Start 2 days before cryotherapy appointment for lip.   venlafaxine  XR 37.5 MG 24 hr capsule Commonly known as: EFFEXOR -XR Take 37.5 mg by mouth daily.        Allergies:  No Known Allergies  Family History: No family history on file.  Social History:   reports that he has never smoked. He has never been exposed to tobacco smoke. He has never used smokeless tobacco. He reports that he does not drink alcohol and does not use drugs.  Physical Exam: There were no vitals taken for this visit.  Constitutional:  Alert and oriented, no acute distress, nontoxic appearing HEENT: [place], AT Cardiovascular: No clubbing, cyanosis, or edema Respiratory: Normal respiratory effort, no increased work of breathing GU: No CVA tenderness Skin: No rashes, bruises or suspicious lesions Neurologic: Grossly intact, no focal deficits, moving all 4 extremities Psychiatric: Normal mood and affect  Laboratory Data: Results for orders placed or performed in visit on 07/17/24  Microscopic Examination   Collection Time: 07/17/24  2:04 PM   Urine  Result Value Ref Range   WBC, UA 0-5 0 - 5 /hpf   RBC, Urine 0-2 0 - 2 /hpf   Epithelial Cells (non renal) 0-10 0 - 10 /hpf   Crystals Present (A) N/A   Crystal Type Amorphous Sediment N/A   Mucus, UA Present (A) Not Estab.   Bacteria, UA Few None seen/Few  Urinalysis, Complete   Collection Time:  07/17/24  2:04 PM  Result Value Ref Range   Specific Gravity, UA 1.025 1.005 - 1.030   pH, UA 6.0 5.0 - 7.5   Color, UA Yellow Yellow   Appearance Ur Clear Clear   Leukocytes,UA Negative Negative   Protein,UA 1+ (A) Negative/Trace   Glucose, UA Negative Negative   Ketones, UA Negative Negative   RBC, UA Negative Negative   Bilirubin, UA Negative Negative   Urobilinogen, Ur 0.2 0.2 - 1.0 mg/dL   Nitrite, UA Negative Negative   Microscopic Examination See below:   BLADDER SCAN AMB NON-IMAGING   Collection Time: 07/17/24  2:18 PM  Result Value Ref Range   Scan Result    Assessment & Plan:   1. Flank pain with history of urolithiasis (Primary) UA bland.  No CVA tenderness on exam.  Will obtain renal ultrasound to look for hydronephrosis and better assess his residual as below.  We discussed that if negative, his pain  is more likely musculoskeletal in origin. - Urinalysis, Complete - US  RENAL; Future  2. Benign prostatic hyperplasia with incomplete bladder emptying PVR slightly elevated today.  He has some mild orthostasis at baseline, will not increase alpha blockers.  I do not think his bladder is full enough to account for his pain.  Will continue to monitor. - BLADDER SCAN AMB NON-IMAGING  Return for Will call with results.  Lucie Hones, PA-C  [facility] [place] [address] [place], [place] [address] [phone]

## 2024-07-17 NOTE — Patient Instructions (Signed)
 Ultrasound scheduling: [phone]

## 2024-07-24 ENCOUNTER — Ambulatory Visit
Admission: RE | Admit: 2024-07-24 | Discharge: 2024-07-24 | Disposition: A | Discharge status: Home / Self Care | Source: Ambulatory Visit | Attending: Physician Assistant | Admitting: Physician Assistant

## 2024-07-24 DIAGNOSIS — Z87442 Personal history of urinary calculi: Secondary | ICD-10-CM | POA: Diagnosis present

## 2024-07-24 DIAGNOSIS — R109 Unspecified abdominal pain: Secondary | ICD-10-CM | POA: Diagnosis present

## 2024-08-01 ENCOUNTER — Ambulatory Visit: Payer: Self-pay | Admitting: Physician Assistant

## 2024-08-26 ENCOUNTER — Other Ambulatory Visit: Payer: Self-pay | Admitting: Physician Assistant

## 2024-08-26 DIAGNOSIS — R2689 Other abnormalities of gait and mobility: Secondary | ICD-10-CM

## 2024-09-01 ENCOUNTER — Other Ambulatory Visit: Payer: Self-pay

## 2024-09-01 ENCOUNTER — Ambulatory Visit: Attending: Neurology | Admitting: Physical Therapy

## 2024-09-01 ENCOUNTER — Encounter: Payer: Self-pay | Admitting: Physical Therapy

## 2024-09-01 DIAGNOSIS — R2681 Unsteadiness on feet: Secondary | ICD-10-CM | POA: Diagnosis present

## 2024-09-01 DIAGNOSIS — M6281 Muscle weakness (generalized): Secondary | ICD-10-CM | POA: Insufficient documentation

## 2024-09-01 DIAGNOSIS — R2689 Other abnormalities of gait and mobility: Secondary | ICD-10-CM | POA: Diagnosis present

## 2024-09-01 DIAGNOSIS — R262 Difficulty in walking, not elsewhere classified: Secondary | ICD-10-CM | POA: Diagnosis present

## 2024-09-01 NOTE — Therapy (Signed)
 OUTPATIENT PHYSICAL THERAPY NEURO EVALUATION   Patient Name: Jonathan Blankenship MRN: [id number] DOB:1940-12-20, 83 y.o., male Today's Date: 09/01/2024   PCP: Diedra Lame, MD  REFERRING PROVIDER: Lane Arthea BRAVO, MD  END OF SESSION:   PT End of Session - 09/01/24 1705     Visit Number 1    Number of Visits 24    Date for Recertification  11/24/24    PT Start Time 1450    PT Stop Time 1530    PT Time Calculation (min) 40 min    Equipment Utilized During Treatment Gait belt    Behavior During Therapy WFL for tasks assessed/performed          Past Medical History:  Diagnosis Date   BPH (benign prostatic hyperplasia)    Coronary artery disease    Depression    GERD (gastroesophageal reflux disease)    History of kidney stones    Hyperlipidemia    Hypertension    IBS (irritable bowel syndrome)    Moderate aortic valve insufficiency    Myocardial infarction (HCC) 07/2000   Paroxysmal atrial fibrillation (HCC)    Pneumonia    Pulmonary nodule    Type 2 diabetes mellitus with stage 3b chronic kidney disease, without long-term current use of insulin [facility])    Past Surgical History:  Procedure Laterality Date   CARDIAC CATHETERIZATION     08/08, 02/28/12, 03/29/12(2 stents)   CATARACT EXTRACTION W/PHACO Left 03/28/2017   Procedure: CATARACT EXTRACTION PHACO AND INTRAOCULAR LENS PLACEMENT (IOC) LEFT;  Surgeon: Mittie Gaskin, MD;  Location: [facility];  Service: Ophthalmology;  Laterality: Left;  IVA BLOCK LEFT   CATARACT EXTRACTION W/PHACO Right 04/18/2017   Procedure: CATARACT EXTRACTION PHACO AND INTRAOCULAR LENS PLACEMENT (IOC) Right IVA BLOCK;  Surgeon: Mittie Gaskin, MD;  Location: [facility];  Service: Ophthalmology;  Laterality: Right;  IVA BLOCK   COLONOSCOPY  08/03/2005   CORONARY ARTERY BYPASS GRAFT  07/23/2000   1 vessel   CYSTOSCOPY W/ URETERAL STENT REMOVAL Left 06/09/2022   Procedure: CYSTOSCOPY WITH STENT REMOVAL;  Surgeon:  Francisca Redell JAYSON, MD;  Location: [facility];  Service: Urology;  Laterality: Left;   CYSTOSCOPY/URETEROSCOPY/HOLMIUM LASER/STENT PLACEMENT Left 05/15/2022   Procedure: CYSTOSCOPY/URETEROSCOPY/HOLMIUM LASER/STENT PLACEMENT;  Surgeon: Francisca Redell JAYSON, MD;  Location: [facility];  Service: Urology;  Laterality: Left;   CYSTOSCOPY/URETEROSCOPY/HOLMIUM LASER/STENT PLACEMENT Right 06/09/2022   Procedure: CYSTOSCOPY/URETEROSCOPY/HOLMIUM LASER/STENT PLACEMENT;  Surgeon: Francisca Redell JAYSON, MD;  Location: [facility];  Service: Urology;  Laterality: Right;   ESOPHAGOGASTRODUODENOSCOPY  01/08/2006   EXPLORATORY LAPAROTOMY  1983   Patient Active Problem List   Diagnosis Date Noted   Acute renal failure (ARF) 05/15/2022   Hydronephrosis with urinary obstruction due to ureteral calculus 05/15/2022   Coronary artery disease 05/15/2022   Essential hypertension 05/15/2022    ONSET DATE: ~April 2025 (6-8 months)  REFERRING DIAG:  R26.89 (ICD-10-CM) - Imbalance  M54.2 (ICD-10-CM) - Neck pain    THERAPY DIAG:  Difficulty in walking, not elsewhere classified  Muscle weakness (generalized)  Other abnormalities of gait and mobility  Unsteadiness on feet  Rationale for Evaluation and Treatment: Rehabilitation  SUBJECTIVE:  SUBJECTIVE STATEMENT: Pt's niece reporting that patient's unsteadiness 6-8 months ago, started with maybe once a week if that, & has become more frequent, now daily even multiple times a day.   Pt reporting that he feels most unsteady when turning, immediately after standing, & also when going to sit in recliner, falls back into it per niece report. Pt reporting that he also sometimes feels unsteady, difficulty to maintain straight path with increased distances.   Nerve conduction study scheduled for  first week of Nov., MRI scheduled for this Wednesday 09/03/24.   Pt accompanied by: family member (niece) [name]  PERTINENT HISTORY: Per neurology note 08/21/24: He has experienced episodes of imbalance for the past six months, feeling as though he might fall backward, but has avoided actual falls by catching himself on nearby objects or with assistance from his niece. These episodes occur without specific triggers and can happen at any time, including when standing up from a chair. He does not use a walker or cane despite having access to them. No dizziness, heart palpitations, sweatiness, or lightheadedness are associated with these episodes. He describes feeling 'wobbly' when walking in a straight line on a hard surface.  He experiences occasional neck and back pain, which worsens with movements such as bending forward. The pain is rated between six and ten on a scale of ten, but he is not taking any medication for it. The pain does not radiate to his legs or arms.  There is a family history of Parkinson's disease, and he has experienced hand tremors in the past, which have since decreased. The tremors were present in both hands and occurred while at rest. He also experiences occasional lip tremors, but no head or leg tremors. No changes in voice, facial expression, or leg shakiness are noted.    PAIN:  Are you having pain? No  PRECAUTIONS: Fall  RED FLAGS: None   WEIGHT BEARING RESTRICTIONS: No  FALLS: Has patient fallen in last 6 months? No; pt states that he hasn't had any falls where he falls to the ground, but many instances where he fell back after standing, where he usually gets caught by his niece, door, wall, etc.   LIVING ENVIRONMENT: Lives with: lives with their family (niece) Lives in: House/apartment Stairs: Yes: External: 3-4 steps; can reach both Has following equipment at home: Single point cane, Walker - 2 wheeled, and shower chair  PLOF: Independent, Independent with  household mobility without device, and Independent with community mobility without device  PATIENT GOALS: keep from falling; feel more balanced  OBJECTIVE:  Note: Objective measures were completed at Evaluation unless otherwise noted.  DIAGNOSTIC FINDINGS: Per neurologist note 08/21/24: Imbalance and abnormal gait Intermittent imbalance and abnormal gait for approximately six months with near falls. No associated dizziness, presyncope. Ddx includes neuropathy, PD. - Order nerve conduction study lowers to evaluate imbalance - Order brain MRI without contrast to evaluate imbalance, difficulty walking - Recommend physical therapy to improve balance and prevent falls.     COGNITION: Overall cognitive status: Within functional limits for tasks assessed   SENSATION:  Crude touch WFL  POSTURE: rounded shoulders, forward head, and increased thoracic kyphosis  LOWER EXTREMITY ROM:     Active  WFL for all tasks assessed Right Eval Left Eval  Hip flexion    Hip extension    Hip abduction    Hip adduction    Hip internal rotation    Hip external rotation    Knee flexion  Knee extension    Ankle dorsiflexion    Ankle plantarflexion    Ankle inversion    Ankle eversion     (Blank rows = not tested)  LOWER EXTREMITY MMT:    MMT Right Eval Left Eval  Hip flexion    Hip extension    Hip abduction    Hip adduction    Hip internal rotation    Hip external rotation    Knee flexion    Knee extension    Ankle dorsiflexion    Ankle plantarflexion    Ankle inversion    Ankle eversion    (Blank rows = not tested)  BED MOBILITY:  Not tested  TRANSFERS: Sit to stand: CGA  Assistive device utilized: None     Stand to sit: CGA  Assistive device utilized: None     Pt with 1 instance of posterior LOB after standing requiring modA from therapist for balance recovery, return to seated safely.   RAMP:  Not tested  CURB:  Not tested  STAIRS: Not tested GAIT: Findings:  Gait Characteristics: step through pattern, decreased arm swing- Right, decreased arm swing- Left, decreased stride length, and wide BOS, Distance walked: distance into/out of clinic, distance for functional testing, Assistive device utilized:None, and Level of assistance: CGA  FUNCTIONAL TESTS:  5 times sit to stand: 16.40 sec with UEs for some push-off at thighs 10 meter walk test: avg normal: 0.65 m/s, avg fast: 1.21 m/s CGA  [facility] PT Assessment - 09/01/24 0001       Balance   Balance Assessed Yes      Standardized Balance Assessment   Standardized Balance Assessment Berg Balance Test      Berg Balance Test   Sit to Stand Able to stand  independently using hands    Standing Unsupported Able to stand 2 minutes with supervision    Sitting with Back Unsupported but Feet Supported on Floor or Stool Able to sit safely and securely 2 minutes    Stand to Sit Uses backs of legs against chair to control descent    Transfers Able to transfer with verbal cueing and /or supervision    Standing Unsupported with Eyes Closed Able to stand 10 seconds with supervision    Standing Unsupported with Feet Together Needs help to attain position and unable to hold for 15 seconds    From Standing, Reach Forward with Outstretched Arm Reaches forward but needs supervision    From Standing Position, Pick up Object from Floor Able to pick up shoe, needs supervision    From Standing Position, Turn to Look Behind Over each Shoulder Needs supervision when turning    Turn 360 Degrees Needs assistance while turning   loses balance towards L   Standing Unsupported, Alternately Place Feet on Step/Stool Able to complete 4 steps without aid or supervision    Standing Unsupported, One Foot in [employer] balance while stepping or standing    Standing on One Leg Unable to try or needs assist to prevent fall    Total Score 24          PATIENT SURVEYS:  ABC scale: The Activities-Specific Balance Confidence (ABC)  Scale 0% 10 20 30  40 50 60 70 80 90 100% No confidence<->completely confident  "How confident are you that you will not lose your balance or become unsteady when you . . .   Date tested 09/01/24  Walk around the house 80%  2. Walk up or down stairs 80%  3.  Bend over and pick up a slipper from in front of a closet floor 90%  4. Reach for a small can off a shelf at eye level 70%  5. Stand on tip toes and reach for something above your head 50%  6. Stand on a chair and reach for something 10%  7. Sweep the floor 80%  8. Walk outside the house to a car parked in the driveway 60%  9. Get into or out of a car 60%  10. Walk across a parking lot to the mall 60%  11. Walk up or down a ramp 60%  12. Walk in a crowded mall where people rapidly walk past you 50%  13. Are bumped into by people as you walk through the mall 50%  14. Step onto or off of an escalator while you are holding onto the railing 60%  15. Step onto or off an escalator while holding onto parcels such that you cannot hold onto the railing 50%  16. Walk outside on icy sidewalks 20%  Total: #/16 58.125%                                                                                                                                 TREATMENT DATE: 09/01/2024 PT initial evaluation; completed subjective history, self reported outcome measure as noted above, & following physical performance measures.    Five times Sit to Stand Test (FTSS) "Stand up and sit down as quickly as possible 5 times, keeping your arms folded across your chest."    TIME: 16.40 seconds w/ some UE pushoff at [place], CGA  Times > 13.6 seconds is associated with increased disability and morbidity (Guralnik, 2000) Times > 15 seconds is predictive of recurrent falls in healthy individuals aged 6 and older (Buatois, et al., 2008) Normal performance values in community dwelling individuals aged 27 and older (Bohannon, 2006): 60-69 years: 11.4 seconds 70-79 years:  12.6 seconds 80-89 years: 14.8 seconds  MCID: >= 2.3 seconds for Vestibular Disorders (Meretta, 2006)  10 Meter Walk Test: Patient instructed to walk 10 meters (32.8 ft) as quickly and as safely as possible at their normal speed x2 and at a fast speed x2. Time measured from 2 meter mark to 8 meter mark to accommodate ramp-up and ramp-down.  Normal speed 1: 0.66 m/s (15.07 seconds) Normal speed 2: 0.64 m/s (15.65 seconds) Average Normal speed: 0.65 m/s CGA Fast speed 1: 1.25 m/s (8.01 seconds) Fast speed 2: 1.17 m/s (8.57 seconds) Average Fast speed: 1.21 m/s CGA Cut off scores: <0.4 m/s = household Ambulator, 0.4-0.8 m/s = limited community Ambulator, >0.8 m/s = community Ambulator, >1.2 m/s = crossing a street, <1.0 = increased fall risk MCID 0.05 m/s (small), 0.13 m/s (moderate), 0.06 m/s (significant)  (ANPTA Core Set of Outcome Measures for Adults with Neurologic Conditions, 2018)  See flowsheet for BERG details. Patient participated in [employer] and demonstrates increased fall risk as  noted by score of   24/56.  (<36= high risk for falls, close to 100%; 37-45 significant >80%; 46-51 moderate >50%; 52-55 lower >25%).   Pt & niece educated of potential benefit of AD for balance d/t pt's increased fall risk as evidenced by physical performance measures.   PATIENT EDUCATION: Education details: familiarization to PT, findings of initial eval, POC.  Person educated: Patient and niece Education method: Explanation and Demonstration Education comprehension: verbalized understanding and needs further education  HOME EXERCISE PROGRAM: To be established at future visit  GOALS: Goals reviewed with patient? Yes  SHORT TERM GOALS: Target date: 10/13/24  Patient will be independent with home exercise program to improve strength/mobility for increased functional independence with ADLs and mobility. Baseline: to be assessed Goal status: INITIAL LONG TERM GOALS: Target date:  11/24/24  Patient will complete five times sit to stand test (5XSTS) in < 15 seconds indicating an increased LE strength and improved balance Baseline: 16.40 sec CGA, using UEs for pushoff at thighs Goal status: INITIAL  2.  Patient will increase Berg Balance score by > 6 points to demonstrate decreased fall risk during functional activities. / Patient will increase Functional Gait Assessment (FGA) score to >20/30 as to reduce fall risk and improve dynamic gait safety with community ambulation. Baseline: 24/56 Goal status: INITIAL  3.  Patient will increase 10 meter walk test to >1.2m/s as to improve gait speed for better community ambulation and to reduce fall risk. Baseline: Average Normal speed: 0.65 m/s, CGA; Average Fast speed: 1.21 m/s CGA Goal status: INITIAL  4.  Patient will improve ABC scale score >80% to demonstrate increased confidence with functional mobility and ADL Baseline: 58.125% Goal status: INITIAL  5.  Patient will increase six minute walk test distance to 471m for progression to community level ambulation, demonstrating improved gait endurance consistent with age, sex based normative values. Baseline: to be assessed Goal status: INITIAL    ASSESSMENT:  CLINICAL IMPRESSION: Patient is a 83 y.o. male who was seen today for physical therapy evaluation and treatment for imbalance. Of note, pt with posterior lean upon standing throughout session; pt with 1 instance of posterior LOB immediately following standing, requiring modA from therapist to safely return to seated surface. During BERG testing, pt with UEs extended forward for balance; pt with increased difficulty with balance activities with arms at sides following verbal cueing. Pt with decreased functional strength as evidenced by 5xSTS. Pt also at high risk of falls, as indicated by 24/56 on BERG & 0.65 m/s normal gait speed. Pt also with decreased confidence in functional mobility as indicated by ABC. Plan to  assess at next session per pt report of increased unsteadiness with increased distances. The pt will benefit from further skilled PT to improve these deficits in order to increase QOL and ease/safety with ADLs.    OBJECTIVE IMPAIRMENTS: Abnormal gait, decreased activity tolerance, decreased balance, decreased coordination, decreased endurance, decreased mobility, difficulty walking, decreased strength, and decreased safety awareness.   ACTIVITY LIMITATIONS: sitting, standing, transfers, and locomotion level  PARTICIPATION LIMITATIONS: interpersonal relationship, shopping, and community activity  PERSONAL FACTORS: Age, Fitness, and 3+ comorbidities: HTN, hyperlipidemia, a-fib, DM are also affecting patient's functional outcome.   REHAB POTENTIAL: Good  CLINICAL DECISION MAKING: Evolving/moderate complexity  EVALUATION COMPLEXITY: Moderate  PLAN:  PT FREQUENCY: 1-2x/week  PT DURATION: 12 weeks  PLANNED INTERVENTIONS: 97164- PT Re-evaluation, 97750- Physical Performance Testing, 97110-Therapeutic exercises, 97530- Therapeutic activity, [id number]- Neuromuscular re-education, 97535- Self Care, [id number]- Manual therapy, 02883-  Gait training, 04007- Canalith repositioning, 79439 (1-2 muscles), 20561 (3+ muscles)- Dry Needling, Patient/Family education, Balance training, Stair training, Joint mobilization, Spinal mobilization, Vestibular training, Cryotherapy, and Moist heat  PLAN FOR NEXT SESSION:  Recommendation for appropriate AD Hip stabilization exercises Activities to promote increased anterior weight shift upon standing Dynamic balance Turning, posterior stepping  Chiquita Silvan, Student-PT 09/01/2024, 5:25 PM

## 2024-09-03 ENCOUNTER — Ambulatory Visit
Admission: RE | Admit: 2024-09-03 | Discharge: 2024-09-03 | Disposition: A | Discharge status: Home / Self Care | Source: Ambulatory Visit | Attending: Physician Assistant | Admitting: Physician Assistant

## 2024-09-03 DIAGNOSIS — R2689 Other abnormalities of gait and mobility: Secondary | ICD-10-CM | POA: Diagnosis present

## 2024-09-15 ENCOUNTER — Ambulatory Visit

## 2024-09-17 ENCOUNTER — Telehealth: Payer: Self-pay

## 2024-09-17 ENCOUNTER — Ambulatory Visit: Attending: Neurology

## 2024-09-17 DIAGNOSIS — R2689 Other abnormalities of gait and mobility: Secondary | ICD-10-CM | POA: Insufficient documentation

## 2024-09-17 DIAGNOSIS — R262 Difficulty in walking, not elsewhere classified: Secondary | ICD-10-CM | POA: Insufficient documentation

## 2024-09-17 DIAGNOSIS — M6281 Muscle weakness (generalized): Secondary | ICD-10-CM | POA: Insufficient documentation

## 2024-09-17 DIAGNOSIS — R2681 Unsteadiness on feet: Secondary | ICD-10-CM | POA: Insufficient documentation

## 2024-09-17 NOTE — Telephone Encounter (Signed)
 Pt did not arrive for scheduled appointment. No telephone call nor message preceeded this absence. Author attempted to contact pt via telephone number listed in chart.Pt reports he remains still with GI symptoms, reports he called and left a message this AM to cancel his appointment. It is not clear that this message was received, will make sure pt has correct clinic number upon return.   4:45 PM, 09/17/24 Peggye JAYSON Linear, PT, DPT Physical Therapist - [facility]  Outpatient Physical Therapy- Main Campus [phone]

## 2024-09-22 ENCOUNTER — Ambulatory Visit

## 2024-09-22 DIAGNOSIS — R262 Difficulty in walking, not elsewhere classified: Secondary | ICD-10-CM

## 2024-09-22 DIAGNOSIS — R2689 Other abnormalities of gait and mobility: Secondary | ICD-10-CM | POA: Diagnosis present

## 2024-09-22 DIAGNOSIS — M6281 Muscle weakness (generalized): Secondary | ICD-10-CM | POA: Diagnosis present

## 2024-09-22 DIAGNOSIS — R2681 Unsteadiness on feet: Secondary | ICD-10-CM | POA: Diagnosis present

## 2024-09-22 NOTE — Therapy (Signed)
 OUTPATIENT PHYSICAL THERAPY TREATMENT  Patient Name: Jonathan Blankenship MRN: [id number] DOB:09/11/1941, 83 y.o., male Today's Date: 09/22/2024  PCP: Diedra Lame, MD  REFERRING PROVIDER: Lane Arthea BRAVO, MD  END OF SESSION:   PT End of Session - 09/22/24 1326     Visit Number 2    Number of Visits 24    Date for Recertification  11/24/24    PT Start Time 1326    PT Stop Time 1356    PT Time Calculation (min) 30 min    Equipment Utilized During Treatment Gait belt    Activity Tolerance Patient tolerated treatment well;No increased pain;Patient limited by fatigue    Behavior During Therapy [facility] for tasks assessed/performed          Past Medical History:  Diagnosis Date   BPH (benign prostatic hyperplasia)    Coronary artery disease    Depression    GERD (gastroesophageal reflux disease)    History of kidney stones    Hyperlipidemia    Hypertension    IBS (irritable bowel syndrome)    Moderate aortic valve insufficiency    Myocardial infarction (HCC) 07/2000   Paroxysmal atrial fibrillation (HCC)    Pneumonia    Pulmonary nodule    Type 2 diabetes mellitus with stage 3b chronic kidney disease, without long-term current use of insulin [facility])    Past Surgical History:  Procedure Laterality Date   CARDIAC CATHETERIZATION     08/08, 02/28/12, 03/29/12(2 stents)   CATARACT EXTRACTION W/PHACO Left 03/28/2017   Procedure: CATARACT EXTRACTION PHACO AND INTRAOCULAR LENS PLACEMENT (IOC) LEFT;  Surgeon: Mittie Gaskin, MD;  Location: [facility];  Service: Ophthalmology;  Laterality: Left;  IVA BLOCK LEFT   CATARACT EXTRACTION W/PHACO Right 04/18/2017   Procedure: CATARACT EXTRACTION PHACO AND INTRAOCULAR LENS PLACEMENT (IOC) Right IVA BLOCK;  Surgeon: Mittie Gaskin, MD;  Location: [facility];  Service: Ophthalmology;  Laterality: Right;  IVA BLOCK   COLONOSCOPY  08/03/2005   CORONARY ARTERY BYPASS GRAFT  07/23/2000   1 vessel   CYSTOSCOPY W/  URETERAL STENT REMOVAL Left 06/09/2022   Procedure: CYSTOSCOPY WITH STENT REMOVAL;  Surgeon: Francisca Redell JAYSON, MD;  Location: [facility] ORS;  Service: Urology;  Laterality: Left;   CYSTOSCOPY/URETEROSCOPY/HOLMIUM LASER/STENT PLACEMENT Left 05/15/2022   Procedure: CYSTOSCOPY/URETEROSCOPY/HOLMIUM LASER/STENT PLACEMENT;  Surgeon: Francisca Redell JAYSON, MD;  Location: [facility] ORS;  Service: Urology;  Laterality: Left;   CYSTOSCOPY/URETEROSCOPY/HOLMIUM LASER/STENT PLACEMENT Right 06/09/2022   Procedure: CYSTOSCOPY/URETEROSCOPY/HOLMIUM LASER/STENT PLACEMENT;  Surgeon: Francisca Redell JAYSON, MD;  Location: [facility] ORS;  Service: Urology;  Laterality: Right;   ESOPHAGOGASTRODUODENOSCOPY  01/08/2006   EXPLORATORY LAPAROTOMY  1983   Patient Active Problem List   Diagnosis Date Noted   Acute renal failure (ARF) 05/15/2022   Hydronephrosis with urinary obstruction due to ureteral calculus 05/15/2022   Coronary artery disease 05/15/2022   Essential hypertension 05/15/2022   ONSET DATE: ~April 2025 (6-8 months)  REFERRING DIAG:  R26.89 (ICD-10-CM) - Imbalance  M54.2 (ICD-10-CM) - Neck pain   THERAPY DIAG:  Difficulty in walking, not elsewhere classified  Muscle weakness (generalized)  Other abnormalities of gait and mobility  Unsteadiness on feet  Rationale for Evaluation and Treatment: Rehabilitation  SUBJECTIVE:  SUBJECTIVE STATEMENT: No falls since last visit- NCVS showing neuropathy.  PERTINENT HISTORY:   Pt's niece reporting that patient's unsteadiness 6-8 months ago, started with maybe once a week if that, & has become more frequent, now daily even multiple times a day. Pt reporting that he feels most unsteady when turning, immediately after standing, & also when going to sit in recliner, falls back into it per niece  report. Pt reporting that he also sometimes feels unsteady, difficulty to maintain straight path with increased distances.  Nerve conduction study scheduled for first week of Nov., MRI scheduled for this Wednesday 09/03/24. Per neurology note 08/21/24: He has experienced episodes of imbalance for the past six months, feeling as though he might fall backward, but has avoided actual falls by catching himself on nearby objects or with assistance from his niece. These episodes occur without specific triggers and can happen at any time, including when standing up from a chair. He does not use a walker or cane despite having access to them. No dizziness, heart palpitations, sweatiness, or lightheadedness are associated with these episodes. He describes feeling 'wobbly' when walking in a straight line on a hard surface.  Pt accompanied by: family member (niece) [name]  He experiences occasional neck and back pain, which worsens with movements such as bending forward. The pain is rated between six and ten on a scale of ten, but he is not taking any medication for it. The pain does not radiate to his legs or arms. There is a family history of Parkinson's disease, and he has experienced hand tremors in the past, which have since decreased. The tremors were present in both hands and occurred while at rest. He also experiences occasional lip tremors, but no head or leg tremors. No changes in voice, facial expression, or leg shakiness are noted.    PAIN:  Are you having pain? No  PRECAUTIONS: Fall  RED FLAGS: None   WEIGHT BEARING RESTRICTIONS: No  FALLS: Has patient fallen in last 6 months? No; pt states that he hasn't had any falls where he falls to the ground, but many instances where he fell back after standing, where he usually gets caught by his niece, door, wall, etc.   LIVING ENVIRONMENT: Lives with: lives with their family (niece) Lives in: House/apartment Stairs: Yes: External: 3-4 steps; can  reach both Has following equipment at home: Single point cane, Walker - 2 wheeled, and shower chair  PLOF: Independent, Independent with household mobility without device, and Independent with community mobility without device  PATIENT GOALS: keep from falling; feel more balanced  OBJECTIVE:  Note: Objective measures were completed at Evaluation unless otherwise noted.  DIAGNOSTIC FINDINGS: Per neurologist note 08/21/24: Imbalance and abnormal gait Intermittent imbalance and abnormal gait for approximately six months with near falls. No associated dizziness, presyncope. Ddx includes neuropathy, PD. - Order nerve conduction study lowers to evaluate imbalance - Order brain MRI without contrast to evaluate imbalance, difficulty walking - Recommend physical therapy to improve balance and prevent falls.     FUNCTIONAL TESTS:  5 times sit to stand: 16.40 sec with UEs for some push-off at thighs 10 meter walk test: avg normal: 0.65 m/s, avg fast: 1.21 m/s CGA  TREATMENT DATE: 09/22/2024  Orthostatic vitals assessment:  129/47mmHg 61bpm seated  116/32mmHg 63bpm standing x1  113/62mmHg 66bpm standing x2  (falls backward into chair prior to completion)   - assessment, no device: 1020ft, no LOB, some increase in sway after 4.5 minutes, no dizziness, no weakness of legs, no SOB -post test vitals:  133/35mmHg 65bpm   -HEP balance exploring:   Access Code: [email or web address] URL: [email or web address] Date: 09/22/2024 Prepared by: Peggye Linear  Exercises - Feet Together Balance at Counter Top Eyes Closed  - 1 x daily - 10 reps - 15 sec hold - Standing Tandem Balance with Unilateral Counter Support  - 1 x daily - 10 sets - 15 sec hold   PATIENT EDUCATION: Education details: familiarization to PT, findings of initial eval, POC.  Person educated: Patient  and niece Education method: Explanation and Demonstration Education comprehension: verbalized understanding and needs further education  HOME EXERCISE PROGRAM: Access Code: [email or web address] URL: [email or web address] Date: 09/22/2024 Prepared by: Peggye Linear *to be performed with niece assisting.  Exercises - Feet Together Balance at [employer] Eyes Closed  - 1 x daily - 10 reps - 15 sec hold - Standing Tandem Balance with Unilateral Counter Support  - 1 x daily - 10 sets - 15 sec hold  GOALS: Goals reviewed with patient? Yes  SHORT TERM GOALS: Target date: 10/13/24  Patient will be independent with home exercise program to improve strength/mobility for increased functional independence with ADLs and mobility. Baseline: to be assessed Goal status: INITIAL LONG TERM GOALS: Target date: 11/24/24  Patient will complete five times sit to stand test (5XSTS) in < 15 seconds indicating an increased LE strength and improved balance Baseline: 16.40 sec CGA, using UEs for pushoff at thighs Goal status: INITIAL  2.  Patient will increase Berg Balance score by > 6 points to demonstrate decreased fall risk during functional activities. / Patient will increase Functional Gait Assessment (FGA) score to >20/30 as to reduce fall risk and improve dynamic gait safety with community ambulation. Baseline: 24/56 Goal status: INITIAL  3.  Patient will increase 10 meter walk test to >1.20m/s as to improve gait speed for better community ambulation and to reduce fall risk. Baseline: Average Normal speed: 0.65 m/s, CGA; Average Fast speed: 1.21 m/s CGA Goal status: INITIAL  4.  Patient will improve ABC scale score >80% to demonstrate increased confidence with functional mobility and ADL Baseline: 58.125% Goal status: INITIAL  5.  Patient will increase six minute walk test distance to 457m for progression to community level ambulation, demonstrating improved gait endurance consistent with age, sex  based normative values. Baseline: 11/10/5: 1071ft Goal status: INITIAL  ASSESSMENT:  CLINICAL IMPRESSION: Pt has abnormal orthostatic VS today. Pt falls into chair during BP assessmet. collected, 1013ft no LOB. Pt/niece given HEP for narrow eyes closed balance and tandem eyes open balance. Pt wants to reduce to 1x/week, however author recommended they show ability to pbe consistent and compliant with HEP first. Patient will benefit from skilled physical therapy intervention to reduce deficits and impairments identified in evaluation, in order to reduce pain, improve quality of life, and maximize activity tolerance for ADL, IADL, and leisure/fitness. Physical therapy will help pt achieve long and short term goals of care.   OBJECTIVE IMPAIRMENTS: Abnormal gait, decreased activity tolerance, decreased balance, decreased coordination, decreased endurance, decreased mobility, difficulty walking, decreased strength, and decreased safety awareness.   ACTIVITY LIMITATIONS: sitting, standing, transfers, and locomotion level  PARTICIPATION LIMITATIONS: interpersonal relationship, shopping, and  community activity  PERSONAL FACTORS: Age, Fitness, and 3+ comorbidities: HTN, hyperlipidemia, a-fib, DM are also affecting patient's functional outcome.   REHAB POTENTIAL: Good  CLINICAL DECISION MAKING: Evolving/moderate complexity  EVALUATION COMPLEXITY: Moderate  PLAN:  PT FREQUENCY: 1-2x/week  PT DURATION: 12 weeks  PLANNED INTERVENTIONS: 97164- PT Re-evaluation, 97750- Physical Performance Testing, 97110-Therapeutic exercises, 97530- Therapeutic activity, 97112- Neuromuscular re-education, 97535- Self Care, 02859- Manual therapy, [phone]- Gait training, [phone]- Canalith repositioning, [phone] (1-2 muscles), 20561 (3+ muscles)- Dry Needling, Patient/Family education, Balance training, Stair training, Joint mobilization, Spinal mobilization, Vestibular training, Cryotherapy, and Moist  heat  PLAN FOR NEXT SESSION:  Recommendation for appropriate AD Hip stabilization exercises Activities to promote increased anterior weight shift upon standing Dynamic balance Turning, posterior stepping  2:05 PM, 09/22/24 Peggye JAYSON Linear, PT, DPT Physical Therapist - [facility]  Outpatient Physical Therapy- Main Campus [phone]

## 2024-09-24 ENCOUNTER — Ambulatory Visit

## 2024-09-29 ENCOUNTER — Ambulatory Visit

## 2024-10-01 ENCOUNTER — Ambulatory Visit

## 2024-10-01 DIAGNOSIS — R262 Difficulty in walking, not elsewhere classified: Secondary | ICD-10-CM | POA: Diagnosis not present

## 2024-10-01 DIAGNOSIS — R2681 Unsteadiness on feet: Secondary | ICD-10-CM

## 2024-10-01 DIAGNOSIS — R2689 Other abnormalities of gait and mobility: Secondary | ICD-10-CM

## 2024-10-01 DIAGNOSIS — M6281 Muscle weakness (generalized): Secondary | ICD-10-CM

## 2024-10-01 NOTE — Therapy (Signed)
 OUTPATIENT PHYSICAL THERAPY TREATMENT  Patient Name: SCHNEIDER WARCHOL MRN: [id number] DOB:[birth year], 83 y.o., male Today's Date: 10/01/2024  PCP: Diedra Lame, MD  REFERRING PROVIDER: Lane Arthea BRAVO, MD  END OF SESSION:   PT End of Session - 10/01/24 1619     Visit Number 3    Number of Visits 24    Date for Recertification  11/24/24    PT Start Time 1621    PT Stop Time 1700    PT Time Calculation (min) 39 min    Equipment Utilized During Treatment Gait belt    Activity Tolerance Patient tolerated treatment well;No increased pain;Patient limited by fatigue    Behavior During Therapy [facility] for tasks assessed/performed         Past Medical History:  Diagnosis Date   BPH (benign prostatic hyperplasia)    Coronary artery disease    Depression    GERD (gastroesophageal reflux disease)    History of kidney stones    Hyperlipidemia    Hypertension    IBS (irritable bowel syndrome)    Moderate aortic valve insufficiency    Myocardial infarction (HCC) 07/2000   Paroxysmal atrial fibrillation (HCC)    Pneumonia    Pulmonary nodule    Type 2 diabetes mellitus with stage 3b chronic kidney disease, without long-term current use of insulin [facility])    Past Surgical History:  Procedure Laterality Date   CARDIAC CATHETERIZATION     08/08, 02/28/12, 03/29/12(2 stents)   CATARACT EXTRACTION W/PHACO Left 03/28/2017   Procedure: CATARACT EXTRACTION PHACO AND INTRAOCULAR LENS PLACEMENT (IOC) LEFT;  Surgeon: Mittie Gaskin, MD;  Location: [facility];  Service: Ophthalmology;  Laterality: Left;  IVA BLOCK LEFT   CATARACT EXTRACTION W/PHACO Right 04/18/2017   Procedure: CATARACT EXTRACTION PHACO AND INTRAOCULAR LENS PLACEMENT (IOC) Right IVA BLOCK;  Surgeon: Mittie Gaskin, MD;  Location: [facility];  Service: Ophthalmology;  Laterality: Right;  IVA BLOCK   COLONOSCOPY  08/03/2005   CORONARY ARTERY BYPASS GRAFT  07/23/2000   1 vessel   CYSTOSCOPY W/  URETERAL STENT REMOVAL Left 06/09/2022   Procedure: CYSTOSCOPY WITH STENT REMOVAL;  Surgeon: Francisca Redell JAYSON, MD;  Location: [facility] ORS;  Service: Urology;  Laterality: Left;   CYSTOSCOPY/URETEROSCOPY/HOLMIUM LASER/STENT PLACEMENT Left 05/15/2022   Procedure: CYSTOSCOPY/URETEROSCOPY/HOLMIUM LASER/STENT PLACEMENT;  Surgeon: Francisca Redell JAYSON, MD;  Location: [facility] ORS;  Service: Urology;  Laterality: Left;   CYSTOSCOPY/URETEROSCOPY/HOLMIUM LASER/STENT PLACEMENT Right 06/09/2022   Procedure: CYSTOSCOPY/URETEROSCOPY/HOLMIUM LASER/STENT PLACEMENT;  Surgeon: Francisca Redell JAYSON, MD;  Location: [facility] ORS;  Service: Urology;  Laterality: Right;   ESOPHAGOGASTRODUODENOSCOPY  01/08/2006   EXPLORATORY LAPAROTOMY  1983   Patient Active Problem List   Diagnosis Date Noted   Acute renal failure (ARF) 05/15/2022   Hydronephrosis with urinary obstruction due to ureteral calculus 05/15/2022   Coronary artery disease 05/15/2022   Essential hypertension 05/15/2022   ONSET DATE: ~April 2025 (6-8 months)  REFERRING DIAG:  R26.89 (ICD-10-CM) - Imbalance  M54.2 (ICD-10-CM) - Neck pain   THERAPY DIAG:  Difficulty in walking, not elsewhere classified  Muscle weakness (generalized)  Other abnormalities of gait and mobility  Unsteadiness on feet  Rationale for Evaluation and Treatment: Rehabilitation  SUBJECTIVE:  SUBJECTIVE STATEMENT:  Pt denies any pain or any complaints upon arrival to the clinic.  Pt's niece accompanying pt and reports he's still having some instances of falling backwards, but is caught by a door or other piece of furniture.    PERTINENT HISTORY:   Pt's niece reporting that patient's unsteadiness 6-8 months ago, started with maybe once a week if that, & has become more frequent, now daily even multiple  times a day. Pt reporting that he feels most unsteady when turning, immediately after standing, & also when going to sit in recliner, falls back into it per niece report. Pt reporting that he also sometimes feels unsteady, difficulty to maintain straight path with increased distances.  Nerve conduction study scheduled for first week of Nov., MRI scheduled for this Wednesday 09/03/24. Per neurology note 08/21/24: He has experienced episodes of imbalance for the past six months, feeling as though he might fall backward, but has avoided actual falls by catching himself on nearby objects or with assistance from his niece. These episodes occur without specific triggers and can happen at any time, including when standing up from a chair. He does not use a walker or cane despite having access to them. No dizziness, heart palpitations, sweatiness, or lightheadedness are associated with these episodes. He describes feeling 'wobbly' when walking in a straight line on a hard surface.  Pt accompanied by: family member (niece) [name]  He experiences occasional neck and back pain, which worsens with movements such as bending forward. The pain is rated between six and ten on a scale of ten, but he is not taking any medication for it. The pain does not radiate to his legs or arms. There is a family history of Parkinson's disease, and he has experienced hand tremors in the past, which have since decreased. The tremors were present in both hands and occurred while at rest. He also experiences occasional lip tremors, but no head or leg tremors. No changes in voice, facial expression, or leg shakiness are noted.    PAIN:  Are you having pain? No  PRECAUTIONS: Fall  RED FLAGS: None   WEIGHT BEARING RESTRICTIONS: No  FALLS: Has patient fallen in last 6 months? No; pt states that he hasn't had any falls where he falls to the ground, but many instances where he fell back after standing, where he usually gets caught by  his niece, door, wall, etc.   LIVING ENVIRONMENT: Lives with: lives with their family (niece) Lives in: House/apartment Stairs: Yes: External: 3-4 steps; can reach both Has following equipment at home: Single point cane, Walker - 2 wheeled, and shower chair  PLOF: Independent, Independent with household mobility without device, and Independent with community mobility without device  PATIENT GOALS: keep from falling; feel more balanced  OBJECTIVE:  Note: Objective measures were completed at Evaluation unless otherwise noted.  DIAGNOSTIC FINDINGS: Per neurologist note 08/21/24: Imbalance and abnormal gait Intermittent imbalance and abnormal gait for approximately six months with near falls. No associated dizziness, presyncope. Ddx includes neuropathy, PD. - Order nerve conduction study lowers to evaluate imbalance - Order brain MRI without contrast to evaluate imbalance, difficulty walking - Recommend physical therapy to improve balance and prevent falls.     FUNCTIONAL TESTS:  5 times sit to stand: 16.40 sec with UEs for some push-off at thighs 10 meter walk test: avg normal: 0.65 m/s, avg fast: 1.21 m/s CGA  TREATMENT DATE: 10/01/2024  Neuromuscular Re-Ed: To facilitate reeducation of movement, balance, posture, coordination, and/or proprioception/kinesthetic sense.  All exercises performed with gait belt and CGA unless otherwise noted:  Static stance on Airex pad, 30 sec bouts Static stance on Airex pad, eyes closed 30 sec bouts Static NBOS on Airex pad, 30 sec bouts Static NBOS on Airex pad, eyes closed, 30 sec bouts Static staggered stance on Airex pad, 30 sec bouts Static staggered stance on Airex pad, eyes closed, 30 sec bouts Static staggered stance one foot on 4 step with airex pad on top, one foot trailing on airex pad placed on floor, eyes  open, 30 sec bouts Static staggered stance one foot on 4 step with airex pad on top, one foot trailing on airex pad placed on floor, eyes closed, 30 sec bouts  Forward ambulation in hallway with horizontal head turns, length of hallway, x1 Forward ambulation in hallway with vertical head turns, length of hallway, x1 Retro ambulation in hallway, length of hallway x1 with  Standing catch and toss with rainbow ball to student, while PT was providing CGA for safety, 3 min   PATIENT EDUCATION: Education details: familiarization to PT, findings of initial eval, POC.  Person educated: Patient and niece Education method: Explanation and Demonstration Education comprehension: verbalized understanding and needs further education  HOME EXERCISE PROGRAM: Access Code: [email or web address] URL: [email or web address] Date: 09/22/2024 Prepared by: Peggye Linear *to be performed with niece assisting.  Exercises - Feet Together Balance at [employer] Eyes Closed  - 1 x daily - 10 reps - 15 sec hold - Standing Tandem Balance with Unilateral Counter Support  - 1 x daily - 10 sets - 15 sec hold  GOALS: Goals reviewed with patient? Yes  SHORT TERM GOALS: Target date: 10/13/24  Patient will be independent with home exercise program to improve strength/mobility for increased functional independence with ADLs and mobility. Baseline: to be assessed Goal status: INITIAL LONG TERM GOALS: Target date: 11/24/24  Patient will complete five times sit to stand test (5XSTS) in < 15 seconds indicating an increased LE strength and improved balance Baseline: 16.40 sec CGA, using UEs for pushoff at thighs Goal status: INITIAL  2.  Patient will increase Berg Balance score by > 6 points to demonstrate decreased fall risk during functional activities. / Patient will increase Functional Gait Assessment (FGA) score to >20/30 as to reduce fall risk and improve dynamic gait safety with community ambulation. Baseline:  24/56 Goal status: INITIAL  3.  Patient will increase 10 meter walk test to >1.43m/s as to improve gait speed for better community ambulation and to reduce fall risk. Baseline: Average Normal speed: 0.65 m/s, CGA; Average Fast speed: 1.21 m/s CGA Goal status: INITIAL  4.  Patient will improve ABC scale score >80% to demonstrate increased confidence with functional mobility and ADL Baseline: 58.125% Goal status: INITIAL  5.  Patient will increase six minute walk test distance to 451m for progression to community level ambulation, demonstrating improved gait endurance consistent with age, sex based normative values. Baseline: 11/10/5: 1050ft Goal status: INITIAL  ASSESSMENT:  CLINICAL IMPRESSION:  Pt responded well to the balance training exercises, however when removing eyesight, pt struggled to keep balance.  Pt typically loses balance posteriorly and the to the contralateral side of the trailing LE is when performing the staggered stance.  Pt otherwise performed well and only needed min-modA a few times to prevent uncontrolled descent.  Pt will continue to benefit from neuro based exercises and strengthening  of the LE's to improve stability.    OBJECTIVE IMPAIRMENTS: Abnormal gait, decreased activity tolerance, decreased balance, decreased coordination, decreased endurance, decreased mobility, difficulty walking, decreased strength, and decreased safety awareness.   ACTIVITY LIMITATIONS: sitting, standing, transfers, and locomotion level  PARTICIPATION LIMITATIONS: interpersonal relationship, shopping, and community activity  PERSONAL FACTORS: Age, Fitness, and 3+ comorbidities: HTN, hyperlipidemia, a-fib, DM are also affecting patient's functional outcome.   REHAB POTENTIAL: Good  CLINICAL DECISION MAKING: Evolving/moderate complexity  EVALUATION COMPLEXITY: Moderate  PLAN:  PT FREQUENCY: 1-2x/week  PT DURATION: 12 weeks  PLANNED INTERVENTIONS: 97164- PT Re-evaluation,  97750- Physical Performance Testing, 97110-Therapeutic exercises, 97530- Therapeutic activity, 97112- Neuromuscular re-education, 97535- Self Care, 02859- Manual therapy, [phone]- Gait training, [phone]- Canalith repositioning, [phone] (1-2 muscles), 20561 (3+ muscles)- Dry Needling, Patient/Family education, Balance training, Stair training, Joint mobilization, Spinal mobilization, Vestibular training, Cryotherapy, and Moist heat  PLAN FOR NEXT SESSION:  Recommendation for appropriate AD Hip stabilization exercises Activities to promote increased anterior weight shift upon standing Dynamic balance Turning, posterior stepping    Fonda Simpers, PT, DPT Physical Therapist - [facility]  10/01/24, 5:53 PM

## 2024-10-06 ENCOUNTER — Ambulatory Visit

## 2024-10-08 ENCOUNTER — Ambulatory Visit: Admitting: Physical Therapy

## 2024-10-08 DIAGNOSIS — R2689 Other abnormalities of gait and mobility: Secondary | ICD-10-CM

## 2024-10-08 DIAGNOSIS — R262 Difficulty in walking, not elsewhere classified: Secondary | ICD-10-CM

## 2024-10-08 DIAGNOSIS — R2681 Unsteadiness on feet: Secondary | ICD-10-CM

## 2024-10-08 DIAGNOSIS — M6281 Muscle weakness (generalized): Secondary | ICD-10-CM

## 2024-10-08 NOTE — Therapy (Signed)
 OUTPATIENT PHYSICAL THERAPY TREATMENT  Patient Name: Jonathan Blankenship MRN: [id number] DOB:1941/10/11, 83 y.o., male Today's Date: 10/08/2024  PCP: Jonathan Lame, MD  REFERRING PROVIDER: Lane Arthea BRAVO, MD  END OF SESSION:   PT End of Session - 10/08/24 1315     Visit Number 4    Number of Visits 24    Date for Recertification  11/24/24    PT Start Time 1315    PT Stop Time 1355    PT Time Calculation (min) 40 min    Equipment Utilized During Treatment Gait belt    Activity Tolerance Patient tolerated treatment well;No increased pain;Patient limited by fatigue    Behavior During Therapy [facility] for tasks assessed/performed          Past Medical History:  Diagnosis Date   BPH (benign prostatic hyperplasia)    Coronary artery disease    Depression    GERD (gastroesophageal reflux disease)    History of kidney stones    Hyperlipidemia    Hypertension    IBS (irritable bowel syndrome)    Moderate aortic valve insufficiency    Myocardial infarction (HCC) 07/2000   Paroxysmal atrial fibrillation (HCC)    Pneumonia    Pulmonary nodule    Type 2 diabetes mellitus with stage 3b chronic kidney disease, without long-term current use of insulin [facility])    Past Surgical History:  Procedure Laterality Date   CARDIAC CATHETERIZATION     08/08, 02/28/12, 03/29/12(2 stents)   CATARACT EXTRACTION W/PHACO Left 03/28/2017   Procedure: CATARACT EXTRACTION PHACO AND INTRAOCULAR LENS PLACEMENT (IOC) LEFT;  Surgeon: Jonathan Gaskin, MD;  Location: [facility];  Service: Ophthalmology;  Laterality: Left;  IVA BLOCK LEFT   CATARACT EXTRACTION W/PHACO Right 04/18/2017   Procedure: CATARACT EXTRACTION PHACO AND INTRAOCULAR LENS PLACEMENT (IOC) Right IVA BLOCK;  Surgeon: Jonathan Gaskin, MD;  Location: [facility];  Service: Ophthalmology;  Laterality: Right;  IVA BLOCK   COLONOSCOPY  08/03/2005   CORONARY ARTERY BYPASS GRAFT  07/23/2000   1 vessel   CYSTOSCOPY W/  URETERAL STENT REMOVAL Left 06/09/2022   Procedure: CYSTOSCOPY WITH STENT REMOVAL;  Surgeon: Jonathan Redell JAYSON, MD;  Location: [facility];  Service: Urology;  Laterality: Left;   CYSTOSCOPY/URETEROSCOPY/HOLMIUM LASER/STENT PLACEMENT Left 05/15/2022   Procedure: CYSTOSCOPY/URETEROSCOPY/HOLMIUM LASER/STENT PLACEMENT;  Surgeon: Jonathan Redell JAYSON, MD;  Location: [facility];  Service: Urology;  Laterality: Left;   CYSTOSCOPY/URETEROSCOPY/HOLMIUM LASER/STENT PLACEMENT Right 06/09/2022   Procedure: CYSTOSCOPY/URETEROSCOPY/HOLMIUM LASER/STENT PLACEMENT;  Surgeon: Jonathan Redell JAYSON, MD;  Location: [facility];  Service: Urology;  Laterality: Right;   ESOPHAGOGASTRODUODENOSCOPY  01/08/2006   EXPLORATORY LAPAROTOMY  1983   Patient Active Problem List   Diagnosis Date Noted   Acute renal failure (ARF) 05/15/2022   Hydronephrosis with urinary obstruction due to ureteral calculus 05/15/2022   Coronary artery disease 05/15/2022   Essential hypertension 05/15/2022   ONSET DATE: ~April 2025 (6-8 months)  REFERRING DIAG:  R26.89 (ICD-10-CM) - Imbalance  M54.2 (ICD-10-CM) - Neck pain   THERAPY DIAG:  Difficulty in walking, not elsewhere classified  Muscle weakness (generalized)  Other abnormalities of gait and mobility  Unsteadiness on feet  Rationale for Evaluation and Treatment: Rehabilitation  SUBJECTIVE:  SUBJECTIVE STATEMENT:  Pt denies any pain or any complaints upon arrival to the clinic.  Pt's niece accompanying pt and reports he's still having some instances of falling backwards and sideways, but has been caught by the chair.     PERTINENT HISTORY:   Pt's niece reporting that patient's unsteadiness 6-8 months ago, started with maybe once a week if that, & has become more frequent, now daily even multiple times  a day. Pt reporting that he feels most unsteady when turning, immediately after standing, & also when going to sit in recliner, falls back into it per niece report. Pt reporting that he also sometimes feels unsteady, difficulty to maintain straight path with increased distances.  Nerve conduction study scheduled for first week of Nov., MRI scheduled for this Wednesday 09/03/24. Per neurology note 08/21/24: He has experienced episodes of imbalance for the past six months, feeling as though he might fall backward, but has avoided actual falls by catching himself on nearby objects or with assistance from his niece. These episodes occur without specific triggers and can happen at any time, including when standing up from a chair. He does not use a walker or cane despite having access to them. No dizziness, heart palpitations, sweatiness, or lightheadedness are associated with these episodes. He describes feeling 'wobbly' when walking in a straight line on a hard surface.  Pt accompanied by: family member (niece) Jonathan Blankenship  He experiences occasional neck and back pain, which worsens with movements such as bending forward. The pain is rated between six and ten on a scale of ten, but he is not taking any medication for it. The pain does not radiate to his legs or arms. There is a family history of Parkinson's disease, and he has experienced hand tremors in the past, which have since decreased. The tremors were present in both hands and occurred while at rest. He also experiences occasional lip tremors, but no head or leg tremors. No changes in voice, facial expression, or leg shakiness are noted.    PAIN:  Are you having pain? No  PRECAUTIONS: Fall  RED FLAGS: None   WEIGHT BEARING RESTRICTIONS: No  FALLS: Has patient fallen in last 6 months? No; pt states that he hasn't had any falls where he falls to the ground, but many instances where he fell back after standing, where he usually gets caught by his  niece, door, wall, etc.   LIVING ENVIRONMENT: Lives with: lives with their family (niece) Lives in: House/apartment Stairs: Yes: External: 3-4 steps; can reach both Has following equipment at home: Single point cane, Walker - 2 wheeled, and shower chair  PLOF: Independent, Independent with household mobility without device, and Independent with community mobility without device  PATIENT GOALS: keep from falling; feel more balanced  OBJECTIVE:  Note: Objective measures were completed at Evaluation unless otherwise noted.  DIAGNOSTIC FINDINGS: Per neurologist note 08/21/24: Imbalance and abnormal gait Intermittent imbalance and abnormal gait for approximately six months with near falls. No associated dizziness, presyncope. Ddx includes neuropathy, PD. - Order nerve conduction study lowers to evaluate imbalance - Order brain MRI without contrast to evaluate imbalance, difficulty walking - Recommend physical therapy to improve balance and prevent falls.     FUNCTIONAL TESTS:  5 times sit to stand: 16.40 sec with UEs for some push-off at thighs 10 meter walk test: avg normal: 0.65 m/s, avg fast: 1.21 m/s CGA  TREATMENT DATE: 10/08/2024  TA- To improve functional movements patterns for everyday tasks/ NMR: To facilitate reeducation of movement, balance, posture, coordination, and/or proprioception/kinesthetic sense.     2 rounds: Gait in hallway with horizontal heads turns, dual task naming letters and numbers on wall x 1  -tendency to navigate toward left  Sit to stand x 10 holding ball to facilitate anterior weight shift  2 rounds: Gait in hallway with vertical head turns x 1  - Lateral LOB and decreased gait speed Front foot elevated on 6 step, back foot on airex x 30 sec ea LE   Fwd step onto dyna-disc 2 x10 ea LE -Occasional UE support when stepping  with left LE due to lateral/posterior LOB  Ladder drills: facing laterally -fwd, bwd, side step x 3 laps  -Min A with occasional posterior LOB during bwd step  Static stance on Airex pad, eyes closed 3 x 30 sec bouts  Static staggered stance on Airex pad, eyes closed, 2 x 30 sec bout ea LE   Unless otherwise stated, CGA was provided and gait belt donned in order to ensure pt safety   PATIENT EDUCATION: Education details: familiarization to PT, findings of initial eval, POC.  Person educated: Patient and niece Education method: Explanation and Demonstration Education comprehension: verbalized understanding and needs further education  HOME EXERCISE PROGRAM: Access Code: [email or web address] URL: [email or web address] Date: 09/22/2024 Prepared by: Peggye Linear *to be performed with niece assisting.  Exercises - Feet Together Balance at [employer] Eyes Closed  - 1 x daily - 10 reps - 15 sec hold - Standing Tandem Balance with Unilateral Counter Support  - 1 x daily - 10 sets - 15 sec hold  GOALS: Goals reviewed with patient? Yes  SHORT TERM GOALS: Target date: 10/13/24  Patient will be independent with home exercise program to improve strength/mobility for increased functional independence with ADLs and mobility. Baseline: to be assessed Goal status: INITIAL LONG TERM GOALS: Target date: 11/24/24  Patient will complete five times sit to stand test (5XSTS) in < 15 seconds indicating an increased LE strength and improved balance Baseline: 16.40 sec CGA, using UEs for pushoff at thighs Goal status: INITIAL  2.  Patient will increase Berg Balance score by > 6 points to demonstrate decreased fall risk during functional activities. / Patient will increase Functional Gait Assessment (FGA) score to >20/30 as to reduce fall risk and improve dynamic gait safety with community ambulation. Baseline: 24/56 Goal status: INITIAL  3.  Patient will increase 10 meter walk test to >1.83m/s  as to improve gait speed for better community ambulation and to reduce fall risk. Baseline: Average Normal speed: 0.65 m/s, CGA; Average Fast speed: 1.21 m/s CGA Goal status: INITIAL  4.  Patient will improve ABC scale score >80% to demonstrate increased confidence with functional mobility and ADL Baseline: 58.125% Goal status: INITIAL  5.  Patient will increase six minute walk test distance to 470m for progression to community level ambulation, demonstrating improved gait endurance consistent with age, sex based normative values. Baseline: 11/10/5: 101ft Goal status: INITIAL  ASSESSMENT:  CLINICAL IMPRESSION:  Pt arrived with good motivation for completion of PT activities. This session focused on dynamic gait and balance on uneven surface. Pt demonstrated posterior and left lateral LOB throughout session requiring min A from SPT. Pt was most challenged with vertical head nods as indicated by decreased gait speed and more frequent lateral LOB. VC for patient to stop when feeling off balance was provided throughout all  activities today. Pt did well maintaining balance with eyes closed. Pt does demonstrate increased sway but he maintained the static stance for 30 sec. Pt will continue to benefit from skilled physical therapy intervention to address impairments, improve QOL, and attain therapy goals.    OBJECTIVE IMPAIRMENTS: Abnormal gait, decreased activity tolerance, decreased balance, decreased coordination, decreased endurance, decreased mobility, difficulty walking, decreased strength, and decreased safety awareness.   ACTIVITY LIMITATIONS: sitting, standing, transfers, and locomotion level  PARTICIPATION LIMITATIONS: interpersonal relationship, shopping, and community activity  PERSONAL FACTORS: Age, Fitness, and 3+ comorbidities: HTN, hyperlipidemia, a-fib, DM are also affecting patient's functional outcome.   REHAB POTENTIAL: Good  CLINICAL DECISION MAKING: Evolving/moderate  complexity  EVALUATION COMPLEXITY: Moderate  PLAN:  PT FREQUENCY: 1-2x/week  PT DURATION: 12 weeks  PLANNED INTERVENTIONS: 97164- PT Re-evaluation, 97750- Physical Performance Testing, 97110-Therapeutic exercises, 97530- Therapeutic activity, [id number]- Neuromuscular re-education, 97535- Self Care, [id number]- Manual therapy, [id number]- Gait training, [phone]- Canalith repositioning, [phone] (1-2 muscles), [id number] (3+ muscles)- Dry Needling, Patient/Family education, Balance training, Stair training, Joint mobilization, Spinal mobilization, Vestibular training, Cryotherapy, and Moist heat  PLAN FOR NEXT SESSION:  Recommendation for appropriate AD Hip stabilization exercises Activities to promote increased anterior weight shift upon standing Dynamic balance Turning, posterior stepping   Leonor Rode, SPT

## 2024-10-13 ENCOUNTER — Ambulatory Visit: Attending: Neurology | Admitting: Physical Therapy

## 2024-10-13 ENCOUNTER — Encounter: Payer: Self-pay | Admitting: Physical Therapy

## 2024-10-13 DIAGNOSIS — M6281 Muscle weakness (generalized): Secondary | ICD-10-CM | POA: Diagnosis present

## 2024-10-13 DIAGNOSIS — R262 Difficulty in walking, not elsewhere classified: Secondary | ICD-10-CM | POA: Diagnosis present

## 2024-10-13 DIAGNOSIS — R2681 Unsteadiness on feet: Secondary | ICD-10-CM | POA: Insufficient documentation

## 2024-10-13 DIAGNOSIS — R2689 Other abnormalities of gait and mobility: Secondary | ICD-10-CM | POA: Diagnosis present

## 2024-10-13 NOTE — Therapy (Unsigned)
 OUTPATIENT PHYSICAL THERAPY TREATMENT  Patient Name: Jonathan Blankenship MRN: [id number] DOB:06-08-1941, 83 y.o., male Today's Date: 10/13/2024  PCP: Diedra Lame, MD  REFERRING PROVIDER: Lane Arthea BRAVO, MD  END OF SESSION:   PT End of Session - 10/13/24 1619     Visit Number 5    Number of Visits 24    Date for Recertification  11/24/24    PT Start Time 1620    PT Stop Time 1705    PT Time Calculation (min) 45 min    Equipment Utilized During Treatment Gait belt    Activity Tolerance Patient tolerated treatment well;No increased pain;Patient limited by fatigue    Behavior During Therapy [facility] for tasks assessed/performed          Past Medical History:  Diagnosis Date   BPH (benign prostatic hyperplasia)    Coronary artery disease    Depression    GERD (gastroesophageal reflux disease)    History of kidney stones    Hyperlipidemia    Hypertension    IBS (irritable bowel syndrome)    Moderate aortic valve insufficiency    Myocardial infarction (HCC) 07/2000   Paroxysmal atrial fibrillation (HCC)    Pneumonia    Pulmonary nodule    Type 2 diabetes mellitus with stage 3b chronic kidney disease, without long-term current use of insulin [facility])    Past Surgical History:  Procedure Laterality Date   CARDIAC CATHETERIZATION     08/08, 02/28/12, 03/29/12(2 stents)   CATARACT EXTRACTION W/PHACO Left 03/28/2017   Procedure: CATARACT EXTRACTION PHACO AND INTRAOCULAR LENS PLACEMENT (IOC) LEFT;  Surgeon: Mittie Gaskin, MD;  Location: [facility];  Service: Ophthalmology;  Laterality: Left;  IVA BLOCK LEFT   CATARACT EXTRACTION W/PHACO Right 04/18/2017   Procedure: CATARACT EXTRACTION PHACO AND INTRAOCULAR LENS PLACEMENT (IOC) Right IVA BLOCK;  Surgeon: Mittie Gaskin, MD;  Location: [facility];  Service: Ophthalmology;  Laterality: Right;  IVA BLOCK   COLONOSCOPY  08/03/2005   CORONARY ARTERY BYPASS GRAFT  07/23/2000   1 vessel   CYSTOSCOPY W/  URETERAL STENT REMOVAL Left 06/09/2022   Procedure: CYSTOSCOPY WITH STENT REMOVAL;  Surgeon: Francisca Redell JAYSON, MD;  Location: [facility];  Service: Urology;  Laterality: Left;   CYSTOSCOPY/URETEROSCOPY/HOLMIUM LASER/STENT PLACEMENT Left 05/15/2022   Procedure: CYSTOSCOPY/URETEROSCOPY/HOLMIUM LASER/STENT PLACEMENT;  Surgeon: Francisca Redell JAYSON, MD;  Location: [facility];  Service: Urology;  Laterality: Left;   CYSTOSCOPY/URETEROSCOPY/HOLMIUM LASER/STENT PLACEMENT Right 06/09/2022   Procedure: CYSTOSCOPY/URETEROSCOPY/HOLMIUM LASER/STENT PLACEMENT;  Surgeon: Francisca Redell JAYSON, MD;  Location: [facility];  Service: Urology;  Laterality: Right;   ESOPHAGOGASTRODUODENOSCOPY  01/08/2006   EXPLORATORY LAPAROTOMY  1983   Patient Active Problem List   Diagnosis Date Noted   Acute renal failure (ARF) 05/15/2022   Hydronephrosis with urinary obstruction due to ureteral calculus 05/15/2022   Coronary artery disease 05/15/2022   Essential hypertension 05/15/2022   ONSET DATE: ~April 2025 (6-8 months)  REFERRING DIAG:  R26.89 (ICD-10-CM) - Imbalance  M54.2 (ICD-10-CM) - Neck pain   THERAPY DIAG:  Difficulty in walking, not elsewhere classified  Muscle weakness (generalized)  Other abnormalities of gait and mobility  Unsteadiness on feet  Rationale for Evaluation and Treatment: Rehabilitation  SUBJECTIVE:  Pt reports feeling okay today, no significant updates since last time. Pt requests to drop down to once a week for appointments due to far distance from clinic. No pain today. No numbness in feet today.  SUBJECTIVE STATEMENT:  Pt denies any pain or any complaints upon arrival to the clinic.  Pt's niece accompanying pt and reports he's still having some instances of falling backwards and sideways, but has been caught by  the chair.     PERTINENT HISTORY:   Pt's niece reporting that patient's unsteadiness 6-8 months ago, started with maybe once a week if that, & has become more frequent, now daily even multiple times a day. Pt reporting that he feels most unsteady when turning, immediately after standing, & also when going to sit in recliner, falls back into it per niece report. Pt reporting that he also sometimes feels unsteady, difficulty to maintain straight path with increased distances.  Nerve conduction study scheduled for first week of Nov., MRI scheduled for this Wednesday 09/03/24. Per neurology note 08/21/24: He has experienced episodes of imbalance for the past six months, feeling as though he might fall backward, but has avoided actual falls by catching himself on nearby objects or with assistance from his niece. These episodes occur without specific triggers and can happen at any time, including when standing up from a chair. He does not use a walker or cane despite having access to them. No dizziness, heart palpitations, sweatiness, or lightheadedness are associated with these episodes. He describes feeling 'wobbly' when walking in a straight line on a hard surface.  Pt accompanied by: family member (niece) [name]  He experiences occasional neck and back pain, which worsens with movements such as bending forward. The pain is rated between six and ten on a scale of ten, but he is not taking any medication for it. The pain does not radiate to his legs or arms. There is a family history of Parkinson's disease, and he has experienced hand tremors in the past, which have since decreased. The tremors were present in both hands and occurred while at rest. He also experiences occasional lip tremors, but no head or leg tremors. No changes in voice, facial expression, or leg shakiness are noted.    PAIN:  Are you having pain? No  PRECAUTIONS: Fall  RED FLAGS: None   WEIGHT BEARING RESTRICTIONS:  No  FALLS: Has patient fallen in last 6 months? No; pt states that he hasn't had any falls where he falls to the ground, but many instances where he fell back after standing, where he usually gets caught by his niece, door, wall, etc.   LIVING ENVIRONMENT: Lives with: lives with their family (niece) Lives in: House/apartment Stairs: Yes: External: 3-4 steps; can reach both Has following equipment at home: Single point cane, Walker - 2 wheeled, and shower chair  PLOF: Independent, Independent with household mobility without device, and Independent with community mobility without device  PATIENT GOALS: keep from falling; feel more balanced  OBJECTIVE:  Note: Objective measures were completed at Evaluation unless otherwise noted.  DIAGNOSTIC FINDINGS: Per neurologist note 08/21/24: Imbalance and abnormal gait Intermittent imbalance and abnormal gait for approximately six months with near falls. No associated dizziness, presyncope. Ddx includes neuropathy, PD. - Order nerve conduction study lowers to evaluate imbalance - Order brain MRI without contrast to evaluate imbalance, difficulty walking - Recommend physical therapy to improve balance and prevent falls.     FUNCTIONAL TESTS:  5 times sit to stand: 16.40 sec with UEs for some push-off at thighs 10 meter walk test: avg normal: 0.65 m/s, avg fast: 1.21 m/s CGA  TREATMENT DATE: 10/13/2024  Gait with horizontal head turns/dual-tasking reading numbers/letter on hallway walls, 2 laps back/forth, min-mod assist needed with one minor LoB to the R Gait with vertical head turns in hallway, 3 laps back/forth, CGA-min assist, one minor LoB to the R Sit to stand w/ weighted blue ball, 3x10, done for encouragement of anterior trunk lean static balance on foam w/ EC, 3x30s static balance on foam w/ NBOS, 2x30 Step ups  from blue foam to aerobic step, L UE support on bar for support, 2x10 fwd/bwd/side-step drills, 3 laps back and forth using white tape line on floor retro ambulation in hallway, 1 lap, increased cadence during lap and minor LOB posteriorly with SPT behind for assistance ball catch/throw 3 min, WBOS Ball catch/throw, 3 min, NBOS   *Unless otherwise stated, CGA was provided and gait belt donned in order to ensure pt safety*   PATIENT EDUCATION: Education details: familiarization to PT, findings of initial eval, POC.  Person educated: Patient and niece Education method: Explanation and Demonstration Education comprehension: verbalized understanding and needs further education  HOME EXERCISE PROGRAM: Access Code: [email or web address] URL: [email or web address] Date: 09/22/2024 Prepared by: Peggye Linear *to be performed with niece assisting.  Exercises - Feet Together Balance at [employer] Eyes Closed  - 1 x daily - 10 reps - 15 sec hold - Standing Tandem Balance with Unilateral Counter Support  - 1 x daily - 10 sets - 15 sec hold  GOALS: Goals reviewed with patient? Yes  SHORT TERM GOALS: Target date: 10/13/24  Patient will be independent with home exercise program to improve strength/mobility for increased functional independence with ADLs and mobility. Baseline: to be assessed Goal status: INITIAL LONG TERM GOALS: Target date: 11/24/24  Patient will complete five times sit to stand test (5XSTS) in < 15 seconds indicating an increased LE strength and improved balance Baseline: 16.40 sec CGA, using UEs for pushoff at thighs Goal status: INITIAL  2.  Patient will increase Berg Balance score by > 6 points to demonstrate decreased fall risk during functional activities. / Patient will increase Functional Gait Assessment (FGA) score to >20/30 as to reduce fall risk and improve dynamic gait safety with community ambulation. Baseline: 24/56 Goal status: INITIAL  3.  Patient will  increase 10 meter walk test to >1.59m/s as to improve gait speed for better community ambulation and to reduce fall risk. Baseline: Average Normal speed: 0.65 m/s, CGA; Average Fast speed: 1.21 m/s CGA Goal status: INITIAL  4.  Patient will improve ABC scale score >80% to demonstrate increased confidence with functional mobility and ADL Baseline: 58.125% Goal status: INITIAL  5.  Patient will increase six minute walk test distance to 436m for progression to community level ambulation, demonstrating improved gait endurance consistent with age, sex based normative values. Baseline: 11/10/5: 1062ft Goal status: INITIAL  ASSESSMENT:  CLINICAL IMPRESSION: Pt reported issues arriving to therapy twice a week due to distance from clinic, and pt was educated on increasing volume and performance of HEP at home to continue to make improvements following dropping to once a week appointments. Pt educated on proper form and performance of new HEP exercises. Pt shows more difficulty and L preference/sway during ambulation with horizontal head turns more so looking to the L. Vertical head turns show less difficulty for pt and less gait deviations and postural sway. Retro ambulation in hallway shows increased difficulty, with pt cadence increasing and increased posterior trunk lean. Pt was cued to keep shoulders forward during backward walking in  in order to prevent LoB backwards. Pt required moderate assistance during EC balance of foam pad today, showing marked increased in sway in all directions. NBOS showed less difficulty on foam pad with decreased postural sway. Pt attempted step ups from foam onto step today with no UE support, but had minor LoB backwards, and utilized L UE support for remained of repetitions of activity. Pt tolerated increase in weight of ball during sit<>stands well in today's session, showing increased power of movement and increased anterior trunk lean. Pt will continue to benefit from  skilled therapy to address remaining deficits in order to improve overall QoL and return to PLOF.      OBJECTIVE IMPAIRMENTS: Abnormal gait, decreased activity tolerance, decreased balance, decreased coordination, decreased endurance, decreased mobility, difficulty walking, decreased strength, and decreased safety awareness.   ACTIVITY LIMITATIONS: sitting, standing, transfers, and locomotion level  PARTICIPATION LIMITATIONS: interpersonal relationship, shopping, and community activity  PERSONAL FACTORS: Age, Fitness, and 3+ comorbidities: HTN, hyperlipidemia, a-fib, DM are also affecting patient's functional outcome.   REHAB POTENTIAL: Good  CLINICAL DECISION MAKING: Evolving/moderate complexity  EVALUATION COMPLEXITY: Moderate  PLAN:  PT FREQUENCY: 1-2x/week  PT DURATION: 12 weeks  PLANNED INTERVENTIONS: 97164- PT Re-evaluation, 97750- Physical Performance Testing, 97110-Therapeutic exercises, 97530- Therapeutic activity, [id number]- Neuromuscular re-education, 97535- Self Care, [id number]- Manual therapy, [id number]- Gait training, [phone]- Canalith repositioning, [phone] (1-2 muscles), [id number] (3+ muscles)- Dry Needling, Patient/Family education, Balance training, Stair training, Joint mobilization, Spinal mobilization, Vestibular training, Cryotherapy, and Moist heat  PLAN FOR NEXT SESSION:  Recommendation for appropriate AD Hip stabilization exercises Activities to promote increased anterior weight shift upon standing Dynamic balance Turning, posterior stepping   Leonor Rode, SPT

## 2024-10-15 ENCOUNTER — Ambulatory Visit

## 2024-10-20 ENCOUNTER — Ambulatory Visit

## 2024-10-22 ENCOUNTER — Ambulatory Visit

## 2024-10-23 ENCOUNTER — Ambulatory Visit: Payer: Self-pay | Admitting: Physician Assistant

## 2024-10-27 ENCOUNTER — Ambulatory Visit: Admitting: Physical Therapy

## 2024-10-29 ENCOUNTER — Ambulatory Visit: Admitting: Physical Therapy

## 2024-11-03 ENCOUNTER — Ambulatory Visit

## 2024-11-10 ENCOUNTER — Ambulatory Visit

## 2024-11-12 ENCOUNTER — Ambulatory Visit: Admitting: Physical Therapy

## 2024-11-17 ENCOUNTER — Ambulatory Visit

## 2024-11-18 ENCOUNTER — Ambulatory Visit (INDEPENDENT_AMBULATORY_CARE_PROVIDER_SITE_OTHER): Admitting: Physician Assistant

## 2024-11-18 ENCOUNTER — Ambulatory Visit
Admission: RE | Admit: 2024-11-18 | Discharge: 2024-11-18 | Disposition: A | Source: Ambulatory Visit | Attending: Physician Assistant | Admitting: Physician Assistant

## 2024-11-18 ENCOUNTER — Other Ambulatory Visit: Payer: Self-pay

## 2024-11-18 ENCOUNTER — Ambulatory Visit
Admission: RE | Admit: 2024-11-18 | Discharge: 2024-11-18 | Disposition: A | Discharge status: Home / Self Care | Source: Ambulatory Visit | Attending: Physician Assistant | Admitting: Physician Assistant

## 2024-11-18 ENCOUNTER — Encounter: Payer: Self-pay | Admitting: Physician Assistant

## 2024-11-18 ENCOUNTER — Ambulatory Visit: Payer: Self-pay | Admitting: Physician Assistant

## 2024-11-18 VITALS — BP 108/58 | HR 61 | Wt 224.2 lb

## 2024-11-18 DIAGNOSIS — N401 Enlarged prostate with lower urinary tract symptoms: Secondary | ICD-10-CM

## 2024-11-18 DIAGNOSIS — M545 Low back pain, unspecified: Secondary | ICD-10-CM | POA: Diagnosis not present

## 2024-11-18 DIAGNOSIS — R3914 Feeling of incomplete bladder emptying: Secondary | ICD-10-CM

## 2024-11-18 DIAGNOSIS — N200 Calculus of kidney: Secondary | ICD-10-CM | POA: Insufficient documentation

## 2024-11-18 LAB — URINALYSIS, COMPLETE
Bilirubin, UA: NEGATIVE
Glucose, UA: NEGATIVE
Leukocytes,UA: NEGATIVE
Nitrite, UA: NEGATIVE
RBC, UA: NEGATIVE
Specific Gravity, UA: 1.025 (ref 1.005–1.030)
Urobilinogen, Ur: 0.2 mg/dL (ref 0.2–1.0)
pH, UA: 6 (ref 5.0–7.5)

## 2024-11-18 LAB — MICROSCOPIC EXAMINATION

## 2024-11-18 NOTE — Progress Notes (Signed)
 "  11/18/2024 3:39 PM   Jonathan Blankenship Acron [birth year] [id number]  CC: Chief Complaint  Patient presents with   Nephrolithiasis   HPI: Jonathan Blankenship is a 84 y.o. male with PMH atrophic right kidney, BPH on Flomax , DM2 on Jardiance, and nephrolithiasis who presents today for routine follow-up.   Today he reports 1 month of mild dysuria and discomfort after voiding without gross hematuria.  He is not sure if he is emptying his bladder appropriately.  He has had some left low back pain for about the past 2 to 3 weeks that he describes as soreness.  He also notes occasional pain in his groin when he coughs, though this does not happen today.  He has not noticed any groin lumps or bumps.  KUB today with no radiopaque urolithiasis.  In-office UA today positive for trace protein and trace ketones; urine microscopy pan negative. PVR 0mL.  PMH: Past Medical History:  Diagnosis Date   BPH (benign prostatic hyperplasia)    Coronary artery disease    Depression    GERD (gastroesophageal reflux disease)    History of kidney stones    Hyperlipidemia    Hypertension    IBS (irritable bowel syndrome)    Moderate aortic valve insufficiency    Myocardial infarction (HCC) 07/2000   Paroxysmal atrial fibrillation (HCC)    Pneumonia    Pulmonary nodule    Type 2 diabetes mellitus with stage 3b chronic kidney disease, without long-term current use of insulin [facility])     Surgical History: Past Surgical History:  Procedure Laterality Date   CARDIAC CATHETERIZATION     08/08, 02/28/12, 03/29/12(2 stents)   CATARACT EXTRACTION W/PHACO Left 03/28/2017   Procedure: CATARACT EXTRACTION PHACO AND INTRAOCULAR LENS PLACEMENT (IOC) LEFT;  Surgeon: Mittie Gaskin, MD;  Location: [facility];  Service: Ophthalmology;  Laterality: Left;  IVA BLOCK LEFT   CATARACT EXTRACTION W/PHACO Right 04/18/2017   Procedure: CATARACT EXTRACTION PHACO AND INTRAOCULAR LENS PLACEMENT (IOC) Right IVA BLOCK;   Surgeon: Mittie Gaskin, MD;  Location: [facility];  Service: Ophthalmology;  Laterality: Right;  IVA BLOCK   COLONOSCOPY  08/03/2005   CORONARY ARTERY BYPASS GRAFT  07/23/2000   1 vessel   CYSTOSCOPY W/ URETERAL STENT REMOVAL Left 06/09/2022   Procedure: CYSTOSCOPY WITH STENT REMOVAL;  Surgeon: Francisca Redell JAYSON, MD;  Location: [facility];  Service: Urology;  Laterality: Left;   CYSTOSCOPY/URETEROSCOPY/HOLMIUM LASER/STENT PLACEMENT Left 05/15/2022   Procedure: CYSTOSCOPY/URETEROSCOPY/HOLMIUM LASER/STENT PLACEMENT;  Surgeon: Francisca Redell JAYSON, MD;  Location: [facility];  Service: Urology;  Laterality: Left;   CYSTOSCOPY/URETEROSCOPY/HOLMIUM LASER/STENT PLACEMENT Right 06/09/2022   Procedure: CYSTOSCOPY/URETEROSCOPY/HOLMIUM LASER/STENT PLACEMENT;  Surgeon: Francisca Redell JAYSON, MD;  Location: [facility];  Service: Urology;  Laterality: Right;   ESOPHAGOGASTRODUODENOSCOPY  01/08/2006   EXPLORATORY LAPAROTOMY  1983    Home Medications:  Allergies as of 11/18/2024   No Known Allergies      Medication List        Accurate as of November 18, 2024  3:39 PM. If you have any questions, ask your nurse or doctor.          acetaminophen  325 MG tablet Commonly known as: TYLENOL  Take by mouth.   albuterol  108 (90 Base) MCG/ACT inhaler Commonly known as: VENTOLIN  HFA Inhale 2 puffs into the lungs every 4 (four) hours as needed for wheezing or shortness of breath.   aspirin  EC 81 MG tablet Take 81 mg by mouth daily.   atorvastatin 40 MG tablet Commonly  known as: LIPITOR Take 40 mg by mouth.   buPROPion ER 100 MG 12 hr tablet Commonly known as: WELLBUTRIN SR Take 100 mg by mouth daily.   ciprofloxacin-dexamethasone  OTIC suspension Commonly known as: CIPRODEX   citalopram  40 MG tablet Commonly known as: CELEXA  Take 40 mg by mouth daily.   isosorbide  dinitrate 30 MG tablet Commonly known as: ISORDIL  Take 30 mg by mouth daily.   losartan 100 MG tablet Commonly known as:  COZAAR Take 100 mg by mouth daily.   metoprolol  tartrate 25 MG tablet Commonly known as: LOPRESSOR  Take 12.5 mg by mouth 2 (two) times daily.   nystatin cream Commonly known as: MYCOSTATIN Apply topically.   omeprazole 20 MG capsule Commonly known as: PRILOSEC Take 20 mg by mouth daily.   pregabalin 25 MG capsule Commonly known as: LYRICA Take by mouth.   simvastatin  40 MG tablet Commonly known as: ZOCOR  Take 40 mg by mouth daily.   tamsulosin  0.4 MG Caps capsule Commonly known as: FLOMAX  Take 1 capsule (0.4 mg total) by mouth at bedtime.   triamcinolone ointment 0.1 % Commonly known as: KENALOG Apply topically 3 (three) times daily.   venlafaxine  XR 37.5 MG 24 hr capsule Commonly known as: EFFEXOR -XR Take 37.5 mg by mouth daily.   venlafaxine  XR 75 MG 24 hr capsule Commonly known as: EFFEXOR -XR Take 75 mg by mouth daily.        Allergies:  Allergies[1]  Family History: History reviewed. No pertinent family history.  Social History:   reports that he has never smoked. He has never been exposed to tobacco smoke. He has never used smokeless tobacco. He reports that he does not drink alcohol and does not use drugs.  Physical Exam: BP (!) 108/58   Pulse 61   Wt 224 lb 3.2 oz (101.7 kg)   BMI 33.11 kg/m   Constitutional:  Alert and oriented, no acute distress, nontoxic appearing HEENT: [place], AT Cardiovascular: No clubbing, cyanosis, or edema Respiratory: Normal respiratory effort, no increased work of breathing MSK: Point tenderness over the left SI joint Skin: No rashes, bruises or suspicious lesions Neurologic: Grossly intact, no focal deficits, moving all 4 extremities Psychiatric: Normal mood and affect  Laboratory Data: Results for orders placed or performed in visit on 11/18/24  Microscopic Examination   Collection Time: 11/18/24  3:43 PM   Urine  Result Value Ref Range   WBC, UA 0-5 0 - 5 /hpf   RBC, Urine 0-2 0 - 2 /hpf   Epithelial Cells  (non renal) 0-10 0 - 10 /hpf   Casts Present (A) None seen /lpf   Cast Type Hyaline casts N/A   Mucus, UA Present (A) Not Estab.   Bacteria, UA Few None seen/Few  Urinalysis, Complete   Collection Time: 11/18/24  3:43 PM  Result Value Ref Range   Specific Gravity, UA 1.025 1.005 - 1.030   pH, UA 6.0 5.0 - 7.5   Color, UA Yellow Yellow   Appearance Ur Clear Clear   Leukocytes,UA Negative Negative   Protein,UA Trace Negative/Trace   Glucose, UA Negative Negative   Ketones, UA Trace (A) Negative   RBC, UA Negative Negative   Bilirubin, UA Negative Negative   Urobilinogen, Ur 0.2 0.2 - 1.0 mg/dL   Nitrite, UA Negative Negative   Microscopic Examination Comment    Microscopic Examination See below:    Pertinent Imaging: KUB, 11/18/2024: See epic  I personally reviewed the images referenced above and note no radiopaque urolithiasis.  Assessment & Plan:   1. Nephrolithiasis (Primary) Asymptomatic, no stone seen on KUB.  Will continue to monitor. - Abdomen 1 view (KUB); Future  2. Benign prostatic hyperplasia with incomplete bladder emptying UA bland, PVR WNL.  Will continue Flomax .  Low suspicion for UTI. - Urinalysis, Complete - Bladder Scan (Post Void Residual) in office  3. 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  Will refer to Ortho. - AMB referral to orthopedics  Return in about 1 year (around 11/18/2025) for Annual IPSS/PVR/KUB prior.  Lucie Hones, PA-C  [facility] [address], [address] [place], [place] [address] [phone]      [1] No Known Allergies  "

## 2024-11-19 ENCOUNTER — Ambulatory Visit: Attending: Neurology | Admitting: Physical Therapy

## 2024-11-19 NOTE — Telephone Encounter (Signed)
 Phone call attempted. No answer. Detailed voicemail provided. Per Sheppard Cheng news, your urinalysis was clear and your bladder scan looks great. No evidence of UTI and evidence show you are emptying very well. Plan to see you back next year as planned clinic telephone number provided for any additional questions or concerns.

## 2024-11-24 ENCOUNTER — Ambulatory Visit: Admitting: Physical Therapy

## 2024-11-26 ENCOUNTER — Ambulatory Visit: Admitting: Physical Therapy

## 2024-12-01 ENCOUNTER — Ambulatory Visit

## 2024-12-03 ENCOUNTER — Ambulatory Visit: Admitting: Physical Therapy

## 2024-12-08 ENCOUNTER — Ambulatory Visit: Admitting: Physical Therapy

## 2024-12-10 ENCOUNTER — Ambulatory Visit: Admitting: Physical Therapy

## 2024-12-10 NOTE — Telephone Encounter (Signed)
 Called patient in regards to his most recent lab results. After his doctor reviewed his labs, he said Kidney function remains stable. GFR of 44. Consistent with chronic kidney disease stage IIIB. No changes are needed at this time. Patient expressed understanding of labs given to him.

## 2024-12-15 ENCOUNTER — Ambulatory Visit: Admitting: Physical Therapy

## 2024-12-17 ENCOUNTER — Ambulatory Visit: Admitting: Physical Therapy

## 2024-12-22 ENCOUNTER — Ambulatory Visit: Admitting: Physical Therapy

## 2024-12-24 ENCOUNTER — Ambulatory Visit: Admitting: Physical Therapy

## 2024-12-29 ENCOUNTER — Ambulatory Visit: Admitting: Physical Therapy

## 2024-12-31 ENCOUNTER — Ambulatory Visit: Admitting: Physical Therapy

## 2025-01-05 ENCOUNTER — Ambulatory Visit: Admitting: Physical Therapy

## 2025-01-07 ENCOUNTER — Ambulatory Visit: Admitting: Physical Therapy

## 2025-01-12 ENCOUNTER — Ambulatory Visit: Admitting: Physical Therapy

## 2025-01-14 ENCOUNTER — Ambulatory Visit: Admitting: Physical Therapy

## 2025-01-19 ENCOUNTER — Ambulatory Visit: Admitting: Physical Therapy

## 2025-01-21 ENCOUNTER — Ambulatory Visit: Admitting: Physical Therapy

## 2025-11-18 ENCOUNTER — Ambulatory Visit: Admitting: Physician Assistant
